# Patient Record
Sex: Female | Born: 1985 | Race: White | Hispanic: No | Marital: Married | Smoking: Never smoker
Health system: Southern US, Community
[De-identification: ages and names within clinical notes are randomized; demographics above are authoritative.]

## PROBLEM LIST (undated history)

## (undated) DIAGNOSIS — I471 Supraventricular tachycardia, unspecified: Secondary | ICD-10-CM

## (undated) DIAGNOSIS — Z1501 Genetic susceptibility to malignant neoplasm of breast: Secondary | ICD-10-CM

## (undated) DIAGNOSIS — Z15068 Genetic susceptibility to other malignant neoplasm of digestive system: Secondary | ICD-10-CM

## (undated) DIAGNOSIS — Z8759 Personal history of other complications of pregnancy, childbirth and the puerperium: Secondary | ICD-10-CM

## (undated) DIAGNOSIS — Z1509 Genetic susceptibility to other malignant neoplasm: Secondary | ICD-10-CM

## (undated) HISTORY — DX: Personal history of other complications of pregnancy, childbirth and the puerperium: Z87.59

## (undated) HISTORY — PX: CYSTECTOMY: SUR359

## (undated) HISTORY — PX: BREAST SURGERY: SHX581

---
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
## 2007-02-19 ENCOUNTER — Encounter (INDEPENDENT_AMBULATORY_CARE_PROVIDER_SITE_OTHER): Payer: Self-pay | Admitting: Surgery

## 2007-02-19 ENCOUNTER — Ambulatory Visit (HOSPITAL_BASED_OUTPATIENT_CLINIC_OR_DEPARTMENT_OTHER): Admission: RE | Admit: 2007-02-19 | Discharge: 2007-02-19 | Payer: Self-pay | Admitting: Surgery

## 2008-09-10 ENCOUNTER — Other Ambulatory Visit: Admission: RE | Admit: 2008-09-10 | Discharge: 2008-09-10 | Payer: Self-pay | Admitting: Family Medicine

## 2008-11-09 ENCOUNTER — Ambulatory Visit: Payer: Self-pay | Admitting: Genetic Counselor

## 2011-01-17 NOTE — Op Note (Signed)
[name], [name]                  ACCOUNT NO.:  [id number]   MEDICAL RECORD NO.:  [id number]          PATIENT TYPE:  AMB   LOCATION:  [facility]                         FACILITY:  [facility]   PHYSICIAN:  Thomas A. Cornett, M.D.DATE OF BIRTH:  [birth year]   DATE OF PROCEDURE:  02/19/2007  DATE OF DISCHARGE:                               OPERATIVE REPORT   PREOPERATIVE DIAGNOSIS:  Right axillary sebaceous cyst measuring 2 cm in  maximal diameter.   POSTOPERATIVE DIAGNOSIS:  Right axillary sebaceous cyst measuring 2 cm  in maximal diameter.   PROCEDURE PERFORMED:  Excision of right axillary sebaceous cyst.   SURGEON:  Harriette Bouillon, M.D.   ANESTHESIA:  MAC with 0.25% Sensorcaine with epinephrine.   ESTIMATED BLOOD LOSS:  20 cc.   SPECIMEN:  Cyst and contents to pathology.   INDICATIONS FOR PROCEDURE:  The patient is a 25 year old female with a  painful epidermal inclusion cyst of the right axilla.  She presents  today for removal.   DESCRIPTION OF PROCEDURE:  The patient was placed supine.  After  induction of MAC anesthesia, the right axillary region was prepped and  draped in a sterile fashion.  I infiltrated the skin with 0.25%  Sensorcaine with epinephrine.  An incision was made at the cyst and the  entire cyst and lining were removed in its entirety.  Cautery was used  for hemostasis.  The wound was closed with 4-0 Monocryl.  Steri-Strips  and dry dressings were applied.  All final counts of sponge, needle, and  instruments were found to be correct for this portion of the case.  The  patient was awoke and taken to recovery in satisfactory condition.      Thomas A. Cornett, M.D.  Electronically Signed     TAC/MEDQ  D:  02/19/2007  T:  02/20/2007  Job:  [id number]   cc:   Thomas A. Cornett, M.D.  [address] [place] [address]  [place] [address]

## 2011-06-21 LAB — CBC
HCT: 38.4
Hemoglobin: 13.2
MCHC: 34.2
MCV: 87.9
Platelets: 207
RBC: 4.37
RDW: 12.8
WBC: 6

## 2011-06-21 LAB — BASIC METABOLIC PANEL
BUN: 9
CO2: 25
Calcium: 8.9
Chloride: 104
Creatinine, Ser: 0.65
GFR calc Af Amer: >60
GFR calc non Af Amer: >60
Glucose, Bld: 92
Potassium: 3.4 — ABNORMAL LOW
Sodium: 137

## 2011-06-21 LAB — POCT PREGNANCY, URINE
Operator id: 268271
Preg Test, Ur: NEGATIVE

## 2011-08-17 DIAGNOSIS — Z803 Family history of malignant neoplasm of breast: Secondary | ICD-10-CM | POA: Insufficient documentation

## 2011-12-21 DIAGNOSIS — Z1501 Genetic susceptibility to malignant neoplasm of breast: Secondary | ICD-10-CM | POA: Insufficient documentation

## 2013-03-19 DIAGNOSIS — D249 Benign neoplasm of unspecified breast: Secondary | ICD-10-CM | POA: Insufficient documentation

## 2015-05-15 ENCOUNTER — Ambulatory Visit (INDEPENDENT_AMBULATORY_CARE_PROVIDER_SITE_OTHER): Payer: 59 | Admitting: Physician Assistant

## 2015-05-15 VITALS — BP 116/60 | HR 100 | Temp 98.0°F | Resp 16 | Ht 69.0 in | Wt 218.0 lb

## 2015-05-15 DIAGNOSIS — Z111 Encounter for screening for respiratory tuberculosis: Secondary | ICD-10-CM | POA: Diagnosis not present

## 2015-05-15 NOTE — Patient Instructions (Signed)
Return in 48-72 hours to have test read.

## 2015-05-15 NOTE — Progress Notes (Signed)
[facility] [address] [place] [address] [phone]  Date:  05/15/2015   Name:  [name]   DOB:  1986/01/29   MRN:  [id number]  PCP:  No primary care provider on file.    Chief Complaint: PPD Placement   History of Present Illness:  This is a 29 y.o. female who is presenting for pre-employment TB test. TB questionnaire negative except for travel to [place] for 2 months 3 years ago. She has had many TB tests before and never positive. She is going to be working for a [employer]. She is not sure if she is up to date on other immunizations but is only wanting tb test today.  Review of Systems:  Review of Systems See HPI  There are no active problems to display for this patient.   Prior to Admission medications   Medication Sig Start Date End Date Taking? Authorizing Provider  Levonorgestrel (SKYLA) 13.5 MG IUD by Intrauterine route.    Historical Provider, MD    Allergies  Allergen Reactions  . Contrast Media [Iodinated Diagnostic Agents]   . Lamictal [Lamotrigine]     Past Surgical History  Procedure Laterality Date  . Breast surgery      Social History  Substance Use Topics  . Smoking status: Never Smoker   . Smokeless tobacco: None  . Alcohol Use: None    Family History  Problem Relation Age of Onset  . Cancer Mother   . Cancer Maternal Grandmother   . Cancer Maternal Grandfather     Medication list has been reviewed and updated.  Physical Examination:  Physical Exam  Constitutional: She is oriented to person, place, and time. She appears well-developed and well-nourished. No distress.  HENT:  Head: Normocephalic and atraumatic.  Right Ear: Hearing normal.  Left Ear: Hearing normal.  Nose: Nose normal.  Eyes: Conjunctivae and lids are normal. Right eye exhibits no discharge. Left eye exhibits no discharge. No scleral icterus.  Pulmonary/Chest: Effort normal. No respiratory distress.  Musculoskeletal: Normal range of  motion.  Neurological: She is alert and oriented to person, place, and time.  Skin: Skin is warm, dry and intact. No lesion and no rash noted.  Psychiatric: She has a normal mood and affect. Her speech is normal and behavior is normal. Thought content normal.   BP 116/60 mmHg  Pulse 100  Temp(Src) 98 F (36.7 C) (Oral)  Resp 16  Ht  (1.753 m)  Wt 218 lb (98.884 kg)  BMI 32.18 kg/m2  SpO2 98%  Assessment and Plan:  1. Screening-pulmonary TB - TB Skin Test   Roswell Miners. Dyke Brackett, MHS [facility]  05/15/2015

## 2015-05-15 NOTE — Progress Notes (Signed)
.   Tuberculosis Risk Questionnaire  1. No Were you born outside the [place] in one of the following parts of the world: Africa, Asia, Central America, South America or Eastern [place]?    2. Yes Have you traveled outside the [place] and lived for more than one month in one of the following parts of the world: Africa, Asia, [place], [place] or Eastern [place]?    3. No Do you have a compromised immune system such as from any of the following conditions:HIV/AIDS, organ or bone marrow transplantation, diabetes, immunosuppressive medicines (e.g. Prednisone, Remicaide), leukemia, lymphoma, cancer of the head or neck, gastrectomy or jejunal bypass, end-stage renal disease (on dialysis), or silicosis?     4. No Have you ever or do you plan on working in: a residential care center, a health care facility, a jail or prison or homeless shelter?    5. No Have you ever: injected illegal drugs, used crack cocaine, lived in a homeless shelter  or been in jail or prison?     6. No Have you ever been exposed to anyone with infectious tuberculosis?    Tuberculosis Symptom Questionnaire  Do you currently have any of the following symptoms?  1. No Unexplained cough lasting more than 3 weeks?   2. No Unexplained fever lasting more than 3 weeks.   3. No Night Sweats (sweating that leaves the bedclothes and sheets wet)     4. No Shortness of Breath   5. No Chest Pain   6. No Unintentional weight loss    7. No Unexplained fatigue (very tired for no reason)   

## 2015-05-18 ENCOUNTER — Ambulatory Visit: Payer: 59

## 2015-05-18 ENCOUNTER — Other Ambulatory Visit: Payer: Self-pay | Admitting: Obstetrics and Gynecology

## 2015-05-18 DIAGNOSIS — Z111 Encounter for screening for respiratory tuberculosis: Secondary | ICD-10-CM

## 2015-05-18 LAB — TB SKIN TEST
Induration: 0 mm
TB Skin Test: NEGATIVE

## 2015-05-19 LAB — CYTOLOGY - PAP

## 2016-03-01 ENCOUNTER — Other Ambulatory Visit: Payer: Self-pay | Admitting: Obstetrics and Gynecology

## 2016-03-01 DIAGNOSIS — N6320 Unspecified lump in the left breast, unspecified quadrant: Secondary | ICD-10-CM

## 2016-03-09 ENCOUNTER — Other Ambulatory Visit: Payer: Self-pay

## 2016-08-24 LAB — OB RESULTS CONSOLE ABO/RH: RH Type: NEGATIVE

## 2016-08-24 LAB — OB RESULTS CONSOLE RUBELLA ANTIBODY, IGM: Rubella: IMMUNE

## 2016-08-24 LAB — OB RESULTS CONSOLE ANTIBODY SCREEN: Antibody Screen: NEGATIVE

## 2016-08-24 LAB — OB RESULTS CONSOLE RPR: RPR: NONREACTIVE

## 2016-08-24 LAB — OB RESULTS CONSOLE HIV ANTIBODY (ROUTINE TESTING): HIV: NONREACTIVE

## 2016-08-24 LAB — OB RESULTS CONSOLE HEPATITIS B SURFACE ANTIGEN: Hepatitis B Surface Ag: NEGATIVE

## 2016-09-04 NOTE — L&D Delivery Note (Addendum)
Delivery Note Birth center patient for hospital birth d/t velamentous CI SROM at 0300 Latent labor augmented w/ oral misoprostol 50 mcg x 1 dose at 0845 Hydrotherapy for pain control.Patient out of tub for second stage d/t increased bloody show.  FHT category 1 in 1st and 2nd stage.   At 3:07 PM a viable female was delivered via Vaginal, Spontaneous Delivery (Presentation: LOA to LOT ; compound L arm ). CAN x 1 tight, baby somersaulted on perineum and cord unwound after delivery.  APGAR: 9, 9; weight 8 lb 6.8 oz (3820 g).   Placenta status: partial separation and brisk bleed noted. Manual removal with uterine guarding. Intermittent atony noted, manual sweep attempted and adherent clot palp in fundus, suspect retained placenta. Placenta inspected, velamentous cord noted, part of placenta plate missing with tearing during manual removal. Uterotonics given - Pitocin IM, misoprostol 200 mcg buccal and 600 mcg rectal, methergine 0.2 mg IM. IV fluids started and Pitocin 20 units in LR 1L bolus started. Dr. Billy Coastaavon paged to room and arrived within 5 min. Manual uterine sweep done by MD under IV Fentanyl 100 mcg analgesia. Per MD no further placental parts felt in uterus, Bleeding minimal after sweep. Vitals stable.  Ancef 2 gm IV one dose given for manual uterine exploration.   Manual exploration completed. Minimal placental tissue obtained. Multiple blood clots evacuated. Good uterine tone. Minimal bleeding after procedure. CBC pending at 1900 and tomorrow 0500.  Anesthesia: Local, IV narcotic  Episiotomy: None Lacerations: 2nd degree;Labial Suture Repair: 2.0 3.0 vicryl Est. Blood Loss (mL): 1300  Mom to postpartum.  Baby to Couplet care / Skin to Skin.  Neta MendsDaniela C Paul, CNM 03/02/2017, 4:01 PM

## 2016-10-20 ENCOUNTER — Other Ambulatory Visit (HOSPITAL_COMMUNITY): Payer: Self-pay

## 2016-10-20 DIAGNOSIS — O43122 Velamentous insertion of umbilical cord, second trimester: Secondary | ICD-10-CM

## 2016-10-20 DIAGNOSIS — O4392 Unspecified placental disorder, second trimester: Secondary | ICD-10-CM

## 2016-11-03 ENCOUNTER — Encounter (HOSPITAL_COMMUNITY): Payer: Self-pay | Admitting: *Deleted

## 2016-11-07 ENCOUNTER — Encounter (HOSPITAL_COMMUNITY): Payer: Self-pay

## 2016-11-07 ENCOUNTER — Ambulatory Visit (HOSPITAL_COMMUNITY)
Admission: RE | Admit: 2016-11-07 | Discharge: 2016-11-07 | Disposition: A | Discharge status: Home / Self Care | Payer: 59 | Source: Ambulatory Visit

## 2016-11-07 ENCOUNTER — Other Ambulatory Visit (HOSPITAL_COMMUNITY): Payer: Self-pay | Admitting: *Deleted

## 2016-11-07 ENCOUNTER — Other Ambulatory Visit (HOSPITAL_COMMUNITY): Payer: Self-pay

## 2016-11-07 DIAGNOSIS — Z363 Encounter for antenatal screening for malformations: Secondary | ICD-10-CM

## 2016-11-07 DIAGNOSIS — O43122 Velamentous insertion of umbilical cord, second trimester: Secondary | ICD-10-CM

## 2016-11-07 DIAGNOSIS — Z3A23 23 weeks gestation of pregnancy: Secondary | ICD-10-CM | POA: Diagnosis not present

## 2016-11-07 DIAGNOSIS — O4392 Unspecified placental disorder, second trimester: Secondary | ICD-10-CM

## 2016-11-07 DIAGNOSIS — O43102 Malformation of placenta, unspecified, second trimester: Secondary | ICD-10-CM

## 2016-11-07 DIAGNOSIS — O43192 Other malformation of placenta, second trimester: Secondary | ICD-10-CM | POA: Insufficient documentation

## 2016-11-07 HISTORY — DX: Genetic susceptibility to malignant neoplasm of breast: Z15.01

## 2016-11-07 HISTORY — DX: Genetic susceptibility to malignant neoplasm of breast: Z15.09

## 2016-11-07 HISTORY — DX: Genetic susceptibility to other malignant neoplasm of digestive system: Z15.068

## 2016-11-20 ENCOUNTER — Encounter (HOSPITAL_COMMUNITY): Payer: Self-pay

## 2016-11-21 ENCOUNTER — Other Ambulatory Visit (HOSPITAL_COMMUNITY): Payer: Self-pay | Admitting: Maternal and Fetal Medicine

## 2016-11-21 ENCOUNTER — Ambulatory Visit (HOSPITAL_COMMUNITY)
Admission: RE | Admit: 2016-11-21 | Discharge: 2016-11-21 | Disposition: A | Discharge status: Home / Self Care | Payer: 59 | Source: Ambulatory Visit

## 2016-11-21 ENCOUNTER — Encounter (HOSPITAL_COMMUNITY): Payer: Self-pay

## 2016-11-21 DIAGNOSIS — Z3A25 25 weeks gestation of pregnancy: Secondary | ICD-10-CM

## 2016-11-21 DIAGNOSIS — Z362 Encounter for other antenatal screening follow-up: Secondary | ICD-10-CM | POA: Insufficient documentation

## 2016-11-21 DIAGNOSIS — O43192 Other malformation of placenta, second trimester: Secondary | ICD-10-CM | POA: Diagnosis not present

## 2016-11-21 DIAGNOSIS — O43102 Malformation of placenta, unspecified, second trimester: Secondary | ICD-10-CM

## 2016-11-21 DIAGNOSIS — Z3686 Encounter for antenatal screening for cervical length: Secondary | ICD-10-CM | POA: Diagnosis not present

## 2016-12-05 ENCOUNTER — Encounter (HOSPITAL_COMMUNITY): Payer: Self-pay

## 2016-12-05 ENCOUNTER — Ambulatory Visit (HOSPITAL_COMMUNITY)
Admission: RE | Admit: 2016-12-05 | Discharge: 2016-12-05 | Disposition: A | Discharge status: Home / Self Care | Payer: 59 | Source: Ambulatory Visit

## 2016-12-05 DIAGNOSIS — O43102 Malformation of placenta, unspecified, second trimester: Secondary | ICD-10-CM

## 2016-12-05 DIAGNOSIS — Z3A27 27 weeks gestation of pregnancy: Secondary | ICD-10-CM | POA: Insufficient documentation

## 2016-12-06 ENCOUNTER — Other Ambulatory Visit (HOSPITAL_COMMUNITY): Payer: Self-pay | Admitting: *Deleted

## 2016-12-06 DIAGNOSIS — O43123 Velamentous insertion of umbilical cord, third trimester: Secondary | ICD-10-CM

## 2017-01-02 ENCOUNTER — Ambulatory Visit (HOSPITAL_COMMUNITY): Payer: 59

## 2017-01-16 ENCOUNTER — Ambulatory Visit (HOSPITAL_COMMUNITY): Payer: 59

## 2017-01-18 ENCOUNTER — Ambulatory Visit (HOSPITAL_COMMUNITY)
Admission: RE | Admit: 2017-01-18 | Discharge: 2017-01-18 | Disposition: A | Discharge status: Home / Self Care | Payer: 59 | Source: Ambulatory Visit

## 2017-01-18 ENCOUNTER — Other Ambulatory Visit (HOSPITAL_COMMUNITY): Payer: Self-pay | Admitting: Maternal & Fetal Medicine

## 2017-01-18 ENCOUNTER — Encounter (HOSPITAL_COMMUNITY): Payer: Self-pay

## 2017-01-18 DIAGNOSIS — Z3A33 33 weeks gestation of pregnancy: Secondary | ICD-10-CM | POA: Diagnosis not present

## 2017-01-18 DIAGNOSIS — O43123 Velamentous insertion of umbilical cord, third trimester: Secondary | ICD-10-CM | POA: Diagnosis present

## 2017-01-18 DIAGNOSIS — Z362 Encounter for other antenatal screening follow-up: Secondary | ICD-10-CM | POA: Insufficient documentation

## 2017-01-18 DIAGNOSIS — O99213 Obesity complicating pregnancy, third trimester: Secondary | ICD-10-CM | POA: Diagnosis not present

## 2017-01-19 ENCOUNTER — Other Ambulatory Visit (HOSPITAL_COMMUNITY): Payer: Self-pay | Admitting: *Deleted

## 2017-02-08 LAB — OB RESULTS CONSOLE GBS: GBS: NEGATIVE

## 2017-02-15 ENCOUNTER — Ambulatory Visit (HOSPITAL_COMMUNITY)
Admission: RE | Admit: 2017-02-15 | Discharge: 2017-02-15 | Disposition: A | Discharge status: Home / Self Care | Payer: 59 | Source: Ambulatory Visit

## 2017-02-15 ENCOUNTER — Encounter (HOSPITAL_COMMUNITY): Payer: Self-pay

## 2017-02-15 DIAGNOSIS — O43129 Velamentous insertion of umbilical cord, unspecified trimester: Secondary | ICD-10-CM | POA: Insufficient documentation

## 2017-02-15 DIAGNOSIS — O99213 Obesity complicating pregnancy, third trimester: Secondary | ICD-10-CM | POA: Insufficient documentation

## 2017-02-15 DIAGNOSIS — Z362 Encounter for other antenatal screening follow-up: Secondary | ICD-10-CM | POA: Diagnosis present

## 2017-02-15 DIAGNOSIS — Z3A37 37 weeks gestation of pregnancy: Secondary | ICD-10-CM | POA: Diagnosis not present

## 2017-03-02 ENCOUNTER — Inpatient Hospital Stay (HOSPITAL_COMMUNITY)
Admission: AD | Admit: 2017-03-02 | Discharge: 2017-03-04 | DRG: 767 | Disposition: A | Discharge status: Home / Self Care | Payer: 59 | Source: Ambulatory Visit | Attending: Obstetrics and Gynecology | Admitting: Obstetrics and Gynecology

## 2017-03-02 ENCOUNTER — Encounter (HOSPITAL_COMMUNITY): Payer: Self-pay

## 2017-03-02 DIAGNOSIS — Z6791 Unspecified blood type, Rh negative: Secondary | ICD-10-CM

## 2017-03-02 DIAGNOSIS — Z3A40 40 weeks gestation of pregnancy: Secondary | ICD-10-CM | POA: Diagnosis not present

## 2017-03-02 DIAGNOSIS — Z3493 Encounter for supervision of normal pregnancy, unspecified, third trimester: Secondary | ICD-10-CM | POA: Diagnosis present

## 2017-03-02 DIAGNOSIS — D649 Anemia, unspecified: Secondary | ICD-10-CM | POA: Diagnosis present

## 2017-03-02 DIAGNOSIS — O43123 Velamentous insertion of umbilical cord, third trimester: Secondary | ICD-10-CM | POA: Diagnosis present

## 2017-03-02 DIAGNOSIS — O26893 Other specified pregnancy related conditions, third trimester: Secondary | ICD-10-CM | POA: Diagnosis present

## 2017-03-02 DIAGNOSIS — Z3490 Encounter for supervision of normal pregnancy, unspecified, unspecified trimester: Secondary | ICD-10-CM

## 2017-03-02 DIAGNOSIS — O99214 Obesity complicating childbirth: Secondary | ICD-10-CM | POA: Diagnosis present

## 2017-03-02 DIAGNOSIS — O9942 Diseases of the circulatory system complicating childbirth: Secondary | ICD-10-CM | POA: Diagnosis present

## 2017-03-02 DIAGNOSIS — E669 Obesity, unspecified: Secondary | ICD-10-CM | POA: Diagnosis present

## 2017-03-02 DIAGNOSIS — O2243 Hemorrhoids in pregnancy, third trimester: Secondary | ICD-10-CM | POA: Diagnosis present

## 2017-03-02 DIAGNOSIS — I471 Supraventricular tachycardia: Secondary | ICD-10-CM | POA: Diagnosis present

## 2017-03-02 DIAGNOSIS — O9902 Anemia complicating childbirth: Secondary | ICD-10-CM | POA: Diagnosis present

## 2017-03-02 DIAGNOSIS — Z6835 Body mass index (BMI) 35.0-35.9, adult: Secondary | ICD-10-CM | POA: Diagnosis not present

## 2017-03-02 HISTORY — DX: Supraventricular tachycardia, unspecified: I47.10

## 2017-03-02 HISTORY — DX: Supraventricular tachycardia: I47.1

## 2017-03-02 LAB — ABO/RH: ABO/RH(D): O NEG

## 2017-03-02 LAB — CBC
HCT: 30.8 % — ABNORMAL LOW (ref 36.0–46.0)
HCT: 31 % — ABNORMAL LOW (ref 36.0–46.0)
HCT: 27.8 % — ABNORMAL LOW (ref 36.0–46.0)
Hemoglobin: 10.3 g/dL — ABNORMAL LOW (ref 12.0–15.0)
Hemoglobin: 10.3 g/dL — ABNORMAL LOW (ref 12.0–15.0)
Hemoglobin: 9.4 g/dL — ABNORMAL LOW (ref 12.0–15.0)
MCH: 28 pg (ref 26.0–34.0)
MCH: 27.8 pg (ref 26.0–34.0)
MCH: 28 pg (ref 26.0–34.0)
MCHC: 33.4 g/dL (ref 30.0–36.0)
MCHC: 33.2 g/dL (ref 30.0–36.0)
MCHC: 33.8 g/dL (ref 30.0–36.0)
MCV: 83.7 fL (ref 78.0–100.0)
MCV: 83.6 fL (ref 78.0–100.0)
MCV: 82.7 fL (ref 78.0–100.0)
Platelets: 208 10*3/uL (ref 150–400)
Platelets: 245 10*3/uL (ref 150–400)
Platelets: 225 10*3/uL (ref 150–400)
RBC: 3.68 MIL/uL — ABNORMAL LOW (ref 3.87–5.11)
RBC: 3.71 MIL/uL — ABNORMAL LOW (ref 3.87–5.11)
RBC: 3.36 MIL/uL — ABNORMAL LOW (ref 3.87–5.11)
RDW: 14.3 % (ref 11.5–15.5)
RDW: 14.4 % (ref 11.5–15.5)
RDW: 14.3 % (ref 11.5–15.5)
WBC: 12.6 10*3/uL — ABNORMAL HIGH (ref 4.0–10.5)
WBC: 24.1 10*3/uL — ABNORMAL HIGH (ref 4.0–10.5)
WBC: 21.6 10*3/uL — ABNORMAL HIGH (ref 4.0–10.5)

## 2017-03-02 LAB — PREPARE RBC (CROSSMATCH)

## 2017-03-02 MED ORDER — ONDANSETRON HCL 4 MG/2ML IJ SOLN: 4.0000 mg | INTRAMUSCULAR | Status: DC | PRN

## 2017-03-02 MED ORDER — FENTANYL CITRATE (PF) 100 MCG/2ML IJ SOLN
INTRAMUSCULAR | Status: AC
  Administered 2017-03-02: 100 ug
  Filled 2017-03-02: qty 2

## 2017-03-02 MED ORDER — FENTANYL CITRATE (PF) 100 MCG/2ML IJ SOLN: 100.0000 ug | Freq: Once | INTRAMUSCULAR | Status: DC

## 2017-03-02 MED ORDER — ZOLPIDEM TARTRATE 5 MG PO TABS: 5.0000 mg | ORAL_TABLET | Freq: Every evening | ORAL | Status: DC | PRN

## 2017-03-02 MED ORDER — ACETAMINOPHEN 325 MG PO TABS
650.0000 mg | ORAL_TABLET | ORAL | Status: DC | PRN
Start: 2017-03-02 — End: 2017-03-04

## 2017-03-02 MED ORDER — BENZOCAINE-MENTHOL 20-0.5 % EX AERO
1.0000 "application " | INHALATION_SPRAY | CUTANEOUS | Status: DC | PRN
  Administered 2017-03-02 – 2017-03-03 (×2): 1 via TOPICAL
  Filled 2017-03-02 (×2): qty 56

## 2017-03-02 MED ORDER — ONDANSETRON HCL 4 MG PO TABS: 4.0000 mg | ORAL_TABLET | ORAL | Status: DC | PRN

## 2017-03-02 MED ORDER — LIDOCAINE HCL (PF) 1 % IJ SOLN
30.0000 mL | INTRAMUSCULAR | Status: DC | PRN
  Administered 2017-03-02: 30 mL via SUBCUTANEOUS
  Filled 2017-03-02: qty 30

## 2017-03-02 MED ORDER — LACTATED RINGERS IV BOLUS (SEPSIS): 1000.0000 mL | Freq: Once | INTRAVENOUS | Status: DC

## 2017-03-02 MED ORDER — WITCH HAZEL-GLYCERIN EX PADS: 1.0000 "application " | MEDICATED_PAD | CUTANEOUS | Status: DC | PRN

## 2017-03-02 MED ORDER — SENNOSIDES-DOCUSATE SODIUM 8.6-50 MG PO TABS
2.0000 | ORAL_TABLET | ORAL | Status: DC
  Administered 2017-03-03 – 2017-03-04 (×2): 2 via ORAL
  Filled 2017-03-02 (×2): qty 2

## 2017-03-02 MED ORDER — OXYTOCIN 10 UNIT/ML IJ SOLN
10.0000 [IU] | Freq: Once | INTRAMUSCULAR | Status: DC
Start: 2017-03-02 — End: 2017-03-02

## 2017-03-02 MED ORDER — SOD CITRATE-CITRIC ACID 500-334 MG/5ML PO SOLN: 30.0000 mL | ORAL | Status: DC | PRN

## 2017-03-02 MED ORDER — MISOPROSTOL 200 MCG PO TABS
50.0000 ug | ORAL_TABLET | ORAL | Status: DC
  Administered 2017-03-02: 50 ug via ORAL
  Filled 2017-03-02: qty 1

## 2017-03-02 MED ORDER — MISOPROSTOL 200 MCG PO TABS
800.0000 ug | ORAL_TABLET | Freq: Once | ORAL | Status: AC
  Administered 2017-03-02: 800 ug via RECTAL

## 2017-03-02 MED ORDER — IBUPROFEN 600 MG PO TABS
600.0000 mg | ORAL_TABLET | Freq: Four times a day (QID) | ORAL | Status: DC
  Administered 2017-03-02 – 2017-03-04 (×6): 600 mg via ORAL
  Filled 2017-03-02 (×8): qty 1

## 2017-03-02 MED ORDER — OXYTOCIN 40 UNITS IN LACTATED RINGERS INFUSION - SIMPLE MED
10.0000 [IU]/h | INTRAVENOUS | Status: DC
  Administered 2017-03-02: 10 [IU]/h via INTRAVENOUS
  Filled 2017-03-02: qty 1000

## 2017-03-02 MED ORDER — CEFAZOLIN SODIUM-DEXTROSE 2-4 GM/100ML-% IV SOLN
2.0000 g | Freq: Once | INTRAVENOUS | Status: AC
  Administered 2017-03-02: 2 g via INTRAVENOUS
  Filled 2017-03-02: qty 100

## 2017-03-02 MED ORDER — BISACODYL 10 MG RE SUPP: 10.0000 mg | Freq: Every day | RECTAL | Status: DC | PRN

## 2017-03-02 MED ORDER — PRENATAL MULTIVITAMIN CH
1.0000 | ORAL_TABLET | Freq: Every day | ORAL | Status: DC
  Administered 2017-03-03 – 2017-03-04 (×2): 1 via ORAL
  Filled 2017-03-02 (×2): qty 1

## 2017-03-02 MED ORDER — ONDANSETRON HCL 4 MG/2ML IJ SOLN: 4.0000 mg | Freq: Four times a day (QID) | INTRAMUSCULAR | Status: DC | PRN

## 2017-03-02 MED ORDER — SIMETHICONE 80 MG PO CHEW
80.0000 mg | CHEWABLE_TABLET | ORAL | Status: DC | PRN
Start: 2017-03-02 — End: 2017-03-04

## 2017-03-02 MED ORDER — METHYLERGONOVINE MALEATE 0.2 MG/ML IJ SOLN: 0.2000 mg | INTRAMUSCULAR | Status: DC | PRN

## 2017-03-02 MED ORDER — METHYLERGONOVINE MALEATE 0.2 MG PO TABS
0.2000 mg | ORAL_TABLET | ORAL | Status: DC | PRN
  Administered 2017-03-02 – 2017-03-03 (×5): 0.2 mg via ORAL
  Filled 2017-03-02 (×5): qty 1

## 2017-03-02 MED ORDER — DIPHENHYDRAMINE HCL 25 MG PO CAPS: 25.0000 mg | ORAL_CAPSULE | Freq: Four times a day (QID) | ORAL | Status: DC | PRN

## 2017-03-02 MED ORDER — SODIUM CHLORIDE 0.9 % IV SOLN: Freq: Once | INTRAVENOUS | Status: DC | PRN

## 2017-03-02 MED ORDER — POLYSACCHARIDE IRON COMPLEX 150 MG PO CAPS
150.0000 mg | ORAL_CAPSULE | Freq: Two times a day (BID) | ORAL | Status: DC
  Administered 2017-03-03 – 2017-03-04 (×4): 150 mg via ORAL
  Filled 2017-03-02 (×4): qty 1

## 2017-03-02 MED ORDER — ACETAMINOPHEN 325 MG PO TABS: 650.0000 mg | ORAL_TABLET | ORAL | Status: DC | PRN

## 2017-03-02 MED ORDER — TERBUTALINE SULFATE 1 MG/ML IJ SOLN
0.2500 mg | Freq: Once | INTRAMUSCULAR | Status: DC | PRN
Start: 2017-03-02 — End: 2017-03-02
  Filled 2017-03-02: qty 1

## 2017-03-02 MED ORDER — FLEET ENEMA 7-19 GM/118ML RE ENEM: 1.0000 | ENEMA | Freq: Every day | RECTAL | Status: DC | PRN

## 2017-03-02 MED ORDER — MAGNESIUM OXIDE 400 (241.3 MG) MG PO TABS
400.0000 mg | ORAL_TABLET | Freq: Every day | ORAL | Status: DC
  Administered 2017-03-03 – 2017-03-04 (×2): 400 mg via ORAL
  Filled 2017-03-02 (×3): qty 1

## 2017-03-02 MED ORDER — OXYCODONE-ACETAMINOPHEN 5-325 MG PO TABS: 1.0000 | ORAL_TABLET | ORAL | Status: DC | PRN

## 2017-03-02 MED ORDER — TETANUS-DIPHTH-ACELL PERTUSSIS 5-2.5-18.5 LF-MCG/0.5 IM SUSP: 0.5000 mL | Freq: Once | INTRAMUSCULAR | Status: DC

## 2017-03-02 MED ORDER — OXYTOCIN 10 UNIT/ML IJ SOLN
INTRAMUSCULAR | Status: AC
  Administered 2017-03-02: 10 [IU]
  Filled 2017-03-02: qty 1

## 2017-03-02 MED ORDER — DIBUCAINE 1 % RE OINT: 1.0000 "application " | TOPICAL_OINTMENT | RECTAL | Status: DC | PRN

## 2017-03-02 MED ORDER — COCONUT OIL OIL: 1.0000 "application " | TOPICAL_OIL | Status: DC | PRN

## 2017-03-02 MED ORDER — OXYCODONE-ACETAMINOPHEN 5-325 MG PO TABS
2.0000 | ORAL_TABLET | ORAL | Status: DC | PRN
Start: 2017-03-02 — End: 2017-03-02

## 2017-03-02 MED ORDER — OXYTOCIN 40 UNITS IN LACTATED RINGERS INFUSION - SIMPLE MED
INTRAVENOUS | Status: AC
  Administered 2017-03-02: 10 [IU]/h via INTRAVENOUS
  Filled 2017-03-02: qty 1000

## 2017-03-02 MED ORDER — METHYLERGONOVINE MALEATE 0.2 MG/ML IJ SOLN
0.2000 mg | Freq: Once | INTRAMUSCULAR | Status: AC
  Administered 2017-03-02: 0.2 mg via INTRAMUSCULAR

## 2017-03-02 NOTE — Progress Notes (Signed)
Alvia Grovenna Sophia Kluever is a 31 y.o. G2P0010 at 2151w0d by LMP admitted for early labor and SROM Misoprostol 50 mcg one dose   Subjective:  Feeling painful ctx, laboring on birthing ball and [facility] present and helping.   Objective: Vitals:   03/02/17 0809  Resp: 18  Temp: 98.4 F (36.9 C)  TempSrc: Oral  Weight: 109.3 kg (241 lb)  Height: 5\' 9"  (1.753 m)    No intake/output data recorded. No intake/output data recorded.   FHT 130's intermittent, no audible decels Ctx q 2-3 min, palp mod/strong  SVE:   Dilation: 6 Effacement (%): 100 Station: -1, 0 Exam by:: Colon Flattery. Ellyn Rubiano CNM  Labs:   Recent Labs  03/02/17 0906  WBC 12.6*  HGB 10.3*  HCT 30.8*  PLT 208    Assessment / Plan: Augmentation of labor, progressing well  Labor:  continue w/ expectant management Preeclampsia:  no signs or symptoms of toxicity Fetal Wellbeing:  Category I Pain Control:  Water tub I/D:  GBS neg Anticipated MOD:  NSVD  Neta Mendsaniela C Reynolds Kittel, CNM, MSN 03/02/2017, 12:38 PM

## 2017-03-02 NOTE — H&P (Signed)
OB ADMISSION/ HISTORY & PHYSICAL:  Admission Date: 03/02/2017  7:37 AM  Admit Diagnosis: Term pregnancy in early labor and SROM  [name] is a 31 y.o. female presents in early labor and SROM at 52 w/ clear AF. Planned hospital birth / desires waterbirth. .  Prenatal History: G2P0010   EDC : 03/02/2017, by Other Basis  Prenatal care at [facility] since 1st trim.  Prenatal course complicated by velamentous CI, previa resolved in 2nd trimester, questionable vasa previa / followed by MFM and cleared for vaginal birth after serial sonos. Pregnancy conceived with IUD Mirena in situ, removed in 1st trimester.  SVT diagnosed during 3rd trimester, cardio w/u done and OK for vaginal birth.  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  Obese and increased maternal weigh gain - TWG 32 lbs  Prenatal Labs: ABO, Rh: O (12/21 0000)  Antibody: Negative (12/21 0000) Rubella: Immune (12/21 0000)  RPR: Nonreactive (12/21 0000)  HBsAg: Negative (12/21 0000)  HIV: Non-reactive (12/21 0000)  GBS: Negative (06/07 0000)  2hr Glucola : wnl  AFP negative  Normal female anatomy,Serial growth sono, last at 72w6dEFW 7'5" (76%)   Medical / Surgical History :  Past medical history:  Past Medical History:  Diagnosis Date  . BRCA2 positive   . SVT (supraventricular tachycardia) (HCC)      Past surgical history:  Past Surgical History:  Procedure Laterality Date  . BREAST SURGERY    . CYSTECTOMY       Family History:  Family History  Problem Relation Age of Onset  . Cancer Mother   . Cancer Maternal Grandmother   . Cancer Maternal Grandfather      Social History:  reports that she has never smoked. She has never used smokeless tobacco. She reports that she does not drink alcohol or use drugs.   Allergies: Contrast media [iodinated diagnostic agents] and Lamictal [lamotrigine]    Current Medications at time of admission:   Prescriptions Prior to Admission  Medication Sig Dispense Refill Last Dose  . ferrous sulfate 325 (65 FE) MG tablet Take 325 mg by mouth daily with breakfast.   03/02/2017  . magnesium 30 MG tablet Take 30 mg by mouth 2 (two) times daily.   03/02/2017  . Prenatal Vit-Fe Fumarate-FA (PRENATAL VITAMIN PO) Take by mouth.   03/02/2017      Review of Systems: ROS + LOF clear, ctx mild, no HA/NV/RUQ pain, + FM.   Physical Exam:  Dilation: 4 Effacement (%): 80 Station: -1 Exam by:: D. Logun Colavito CNM  Clear AF  VSSAF  General:AAO x 3, NAD Heart: intermittent sinus tachy, asymptomatic Lungs:CTAB Abdomen: gravid, NT, vertex per Leopolds Extremities: + 1 pedal edema FHR: 140, mod var, + accels, no decels TOCO: q 2-5 min, mild  Labs:     Recent Labs  03/02/17 0906  WBC 12.6*  HGB 10.3*  HCT 30.8*  PLT 208     Assessment:  31y.o. G2P0010 at 420w0dROM, Hx SVT, Velamentous CI  1. Labor: early 2. Fetal Wellbeing: Category 1  3. Pain Control: desires unmedicated 4. GBS: neg   Plan:  1. Admit to BS 2. Routine L&D orders 3. Misoprostol 50 mcg buccal for cervical ripening / labor augmentation 4. Plan waterbirth / hydrotherapy when active    Consultant: Dr. TaPenelope CoopCNM, MSN 03/02/2017, 10:51 AM

## 2017-03-02 NOTE — Anesthesia Pain Management Evaluation Note (Signed)
  CRNA Pain Management Visit Note  Patient: [name], 31 y.o., female  "Hello [facility]. We have an anesthesia team available at all times to provide care throughout the hospital, including epidural management and anesthesia for C-section. I don't know your plan for the delivery whether it a natural birth, water birth, IV sedation, nitrous supplementation, doula or epidural, but we want to meet your pain goals."   1.Was your pain managed to your expectations on prior hospitalizations?   No prior hospitalizations  2.What is your expectation for pain management during this hospitalization?     Labor support without medications  3.How can we help you reach that goal? Support prn.  Record the patient's initial score and the patient's pain goal.   Pain: 0  Pain Goal: 9 [facility] wants you to be able to say your pain was always managed very well.  [facility] HospitalWRINKLE,[name] 03/02/2017

## 2017-03-02 NOTE — Lactation Note (Addendum)
This note was copied from a baby's chart. Lactation Consultation Note  Patient Name: [name] RUEAV'WToday's Date: 03/02/2017 Reason for consult: Initial assessment   Initial assessment with first time mom of 1 hour old infant. Infant was latched and feeding when LC entered room. EBL > 1300 cc. Mom has doula resent in the room.   Enc mom to BF infant STS 8-12 x in 24 hours at first feeding cues offering both breasts with each feeding. Enc mom to use head and pillow support with feeding. Reviewed BF basics, colostrum, milk coming to volume, supply and demand, cluster feeding, NB nutritional needs and NB feeding behaviors.   Mom reports she does not want to learn hand expression at this time, she reports she has read about it. Enc mom to hand express before and after latch. Mom without further questions a this time. Enc mom to call for assistance as needed.   BF Resources Handout and LC Brochure given, mom informed of IP/OP Services, BF Support Groups and LC phone #.      Maternal Data Formula Feeding for Exclusion: No Has patient been taught Hand Expression?: No Does the patient have breastfeeding experience prior to this delivery?: No  Feeding Feeding Type: Breast Fed  LATCH Score/Interventions Latch: Grasps breast easily, tongue down, lips flanged, rhythmical sucking.  Audible Swallowing: None Intervention(s): Skin to skin  Type of Nipple: Everted at rest and after stimulation  Comfort (Breast/Nipple): Soft / non-tender     Hold (Positioning): No assistance needed to correctly position infant at breast.  LATCH Score: 8  Lactation Tools Discussed/Used WIC Program: No   Consult Status Consult Status: Follow-up Date: 03/03/17 Follow-up type: In-patient    Silas FloodSharon S Izack Hoogland 03/02/2017, 5:12 PM

## 2017-03-03 DIAGNOSIS — O9902 Anemia complicating childbirth: Secondary | ICD-10-CM | POA: Diagnosis present

## 2017-03-03 LAB — CBC
HCT: 26.3 % — ABNORMAL LOW (ref 36.0–46.0)
Hemoglobin: 8.9 g/dL — ABNORMAL LOW (ref 12.0–15.0)
MCH: 28.1 pg (ref 26.0–34.0)
MCHC: 33.8 g/dL (ref 30.0–36.0)
MCV: 83 fL (ref 78.0–100.0)
Platelets: 197 10*3/uL (ref 150–400)
RBC: 3.17 MIL/uL — ABNORMAL LOW (ref 3.87–5.11)
RDW: 14.5 % (ref 11.5–15.5)
WBC: 15.6 10*3/uL — ABNORMAL HIGH (ref 4.0–10.5)

## 2017-03-03 LAB — RPR: RPR Ser Ql: NONREACTIVE

## 2017-03-03 NOTE — Progress Notes (Signed)
MOB was referred for history of depression/anxiety.  Referral is screened out by Clinical Social Worker because none of the following criteria appear to apply and there are no reports impacting the pregnancy or her transition to the postpartum period. CSW does not deem it clinically necessary to further investigate at this time.  -History of anxiety/depression during this pregnancy, or of post-partum depression - Diagnosis of anxiety and/or depression within last 3 years  - History of depression due to pregnancy loss/loss of child or -MOB's symptoms are currently being treated with medication and/or therapy.  No hx of anxiety noted in MOB's chart. Please contact the Clinical Social Worker if needs arise or upon MOB request.   Yomar Mejorado, MSW, LCSW-A Clinical Social Worker  [facility]  Office: [phone]  

## 2017-03-03 NOTE — Progress Notes (Signed)
PPD # 1 SVD Information for the patient's newborn:  Charlott HollerRiley, Girl Daesia [[id number]][[id number]]  female S/P NSVB / manual removal of placenta / PPH 1300cc   breast feeding  Baby name: Grayland JackSibyl Ann  S:  Reports feeling sore but well, no dizziness or chest pain.             Tolerating po/ No nausea or vomiting             Bleeding is decreased             Pain controlled with ibuprofen (OTC)             Up ad lib / ambulatory / voiding without difficulties        O:  A & O x 3, in no apparent distress              VS:  Vitals:   03/02/17 1725 03/02/17 1830 03/02/17 2230 03/03/17 0600  BP: 119/77 139/71 118/69 121/67  Pulse: (!) 105 (!) 115 94 (!) 57  Resp: 20 20 18 18   Temp: 98.3 F (36.8 C) 99 F (37.2 C)  97.4 F (36.3 C)  TempSrc: Oral Oral    Weight:      Height:        LABS:  Recent Labs  03/02/17 1907 03/03/17 0517  WBC 21.6* 15.6*  HGB 9.4* 8.9*  HCT 27.8* 26.3*  PLT 225 197    Blood type: --/--/O NEG (06/29 0915)  Rubella: Immune (12/21 0000)   I&O: I/O last 3 completed shifts: In: -  Out: 1300 [Blood:1300]          No intake/output data recorded.  Lungs: Clear and unlabored  Heart: regular rate and rhythm / no murmurs  Abdomen: soft, non-tender, non-distended             Fundus: firm, non-tender, U-1  Perineum: repair intact, small hemorrhoid noted  Lochia: small  Extremities: no edema, no calf pain or tenderness    A/P: PPD # 1 31 y.o., G2P0010   Principal Problem:   Postpartum care following vaginal delivery 6/29 Active Problems:   Postpartum hemorrhage 1300 cc   Maternal anemia, with delivery   Second-degree perineal laceration, with delivery RH negative - newborn Rh neg - no rhophylac indicated.  Hemorrhoid - mild inflammation   Doing well - stable status  Routine post partum orders  Oral Fe and Mag ox started, alfalfa at discharge  Anusol HC TID, bowel care  Lactation support.   Anticipate discharge tomorrow    Neta Mendsaniela C Mao Lockner, MSN, CNM 03/03/2017,  9:40 AM

## 2017-03-03 NOTE — Lactation Note (Signed)
This note was copied from a baby's chart. Lactation Consultation Note  Patient Name: [name] ZOXWR'UToday's Date: 03/03/2017 Reason for consult: Follow-up assessment Baby sleepy today but Mom reports has latched well without the nipple shield. Assisted Mom with latching baby at this visit, took several attempts and breast compression for baby to sustain latch. Initially non-nutritive at breast but became more nutritive while nursing. Stimulation needed to keep baby awake at breast. Baby now 1826 hours old. Advised parents baby should be at breast 8-12 times in 24 hours and with feeding ques. Keep baby nursing for 15-30 minutes both breasts some feedings, cluster feeding discussed. If baby not staying engaged at breast or sustaining depth with latch use 16 nipple shield. If baby not waking to BF then advised to hand express and have RN demonstrate how to spoon feed or finger feed baby to give her some calories to wake to BF. Set up DEBP for Mom to post pump for 15 minutes at least 4 times/day. Mom had manual removal of placenta and EBL of 1300. Mom does have lots of colostrum with hand expression. Mom to call for assist as needed.   Maternal Data    Feeding Feeding Type: Breast Fed Length of feed: 30 min  LATCH Score/Interventions Latch: Repeated attempts needed to sustain latch, nipple held in mouth throughout feeding, stimulation needed to elicit sucking reflex. Intervention(s): Adjust position;Assist with latch;Breast massage;Breast compression  Audible Swallowing: A few with stimulation  Type of Nipple: Everted at rest and after stimulation (short nipple shafts bilateral)  Comfort (Breast/Nipple): Soft / non-tender     Hold (Positioning): Assistance needed to correctly position infant at breast and maintain latch.  LATCH Score: 7  Lactation Tools Discussed/Used Tools: Pump Breast pump type: Double-Electric Breast Pump Pump Review: Setup, frequency, and cleaning;Milk  Storage Initiated by:: KG Date initiated:: 03/03/17   Consult Status Consult Status: Follow-up Date: 03/04/17 Follow-up type: In-patient    Alfred LevinsGranger, Ilham Roughton Ann 03/03/2017, 5:49 PM

## 2017-03-04 ENCOUNTER — Encounter (HOSPITAL_COMMUNITY): Payer: Self-pay | Admitting: *Deleted

## 2017-03-04 MED ORDER — POLYSACCHARIDE IRON COMPLEX 150 MG PO CAPS: 150.0000 mg | ORAL_CAPSULE | Freq: Two times a day (BID) | ORAL | Status: DC

## 2017-03-04 MED ORDER — HYDROCORTISONE 2.5 % RE CREA: TOPICAL_CREAM | RECTAL | 1 refills | Status: AC

## 2017-03-04 MED ORDER — IBUPROFEN 600 MG PO TABS: 600.0000 mg | ORAL_TABLET | Freq: Four times a day (QID) | ORAL | 0 refills | Status: DC

## 2017-03-04 MED ORDER — ALFALFA 500 MG PO TABS: 2000.0000 mg | ORAL_TABLET | Freq: Every day | ORAL | 0 refills | Status: DC

## 2017-03-04 MED ORDER — MAGNESIUM OXIDE 400 (241.3 MG) MG PO TABS: 400.0000 mg | ORAL_TABLET | Freq: Every day | ORAL | Status: DC

## 2017-03-04 MED ORDER — COCONUT OIL OIL: 1.0000 "application " | TOPICAL_OIL | 0 refills | Status: DC | PRN

## 2017-03-04 MED ORDER — BENZOCAINE-MENTHOL 20-0.5 % EX AERO: 1.0000 "application " | INHALATION_SPRAY | CUTANEOUS | Status: DC | PRN

## 2017-03-04 MED ORDER — ACETAMINOPHEN 325 MG PO TABS: 650.0000 mg | ORAL_TABLET | ORAL | Status: DC | PRN

## 2017-03-04 NOTE — Discharge Summary (Signed)
Obstetric Discharge Summary Reason for Admission: onset of labor and rupture of membranes Prenatal Procedures: ultrasound Intrapartum Procedures: spontaneous vaginal delivery and hydrotherapy Postpartum Procedures: antibiotics Complications-Operative and Postpartum: 2nd degree perineal laceration and hemorrhage Hemoglobin  Date Value Ref Range Status  03/03/2017 8.9 (L) 12.0 - 15.0 g/dL Final   HCT  Date Value Ref Range Status  03/03/2017 26.3 (L) 36.0 - 46.0 % Final    Physical Exam:  General: alert, cooperative and no distress Lochia: appropriate Uterine Fundus: firm Incision: healing well, no significant erythema DVT Evaluation: No cords or calf tenderness. No significant calf/ankle edema.  Discharge Diagnoses: Term Pregnancy-delivered and anemia  Discharge Information: Date: 03/04/2017 Activity: pelvic rest Diet: routine Medications:  Allergies as of 03/04/2017      Reactions   Carrot Oil Anaphylaxis   Contrast Media [iodinated Diagnostic Agents] Anaphylaxis   Gadobenate Shortness Of Breath   Experienced difficulty breathing   Lamictal [lamotrigine] Anaphylaxis      Medication List    STOP taking these medications   ferrous sulfate 325 (65 FE) MG tablet     TAKE these medications   acetaminophen 325 MG tablet Commonly known as:  TYLENOL Take 2 tablets (650 mg total) by mouth every 4 (four) hours as needed (for pain scale < 4).   Alfalfa 500 MG Tabs Take 4 tablets (2,000 mg total) by mouth daily. Start with 500 mg and increase to 2000 mg over 1-2 weeks.   benzocaine-Menthol 20-0.5 % Aero Commonly known as:  DERMOPLAST Apply 1 application topically as needed for irritation (perineal discomfort).   clindamycin 1 % gel Commonly known as:  CLINDAGEL Apply 1 application topically 2 (two) times daily as needed (breakout).   coconut oil Oil Apply 1 application topically as needed.   hydrocortisone 2.5 % rectal cream Commonly known as:  ANUSOL-HC Apply  rectally 2 times daily   ibuprofen 600 MG tablet Commonly known as:  ADVIL,MOTRIN Take 1 tablet (600 mg total) by mouth every 6 (six) hours.   iron polysaccharides 150 MG capsule Commonly known as:  NIFEREX Take 1 capsule (150 mg total) by mouth 2 (two) times daily.   magnesium 30 MG tablet Take 30 mg by mouth 2 (two) times daily.   magnesium oxide 400 (241.3 Mg) MG tablet Commonly known as:  MAG-OX Take 1 tablet (400 mg total) by mouth daily.   metoprolol tartrate 50 MG tablet Commonly known as:  LOPRESSOR Take 1 tablet by mouth daily as needed (palpitations).   PRENATAL VITAMIN PO Take 1 tablet by mouth daily.      Condition: stable Instructions: refer to practice specific booklet Discharge to: home Follow-up Information    Neta Mendsaul, Daniela C, CNM. Schedule an appointment as soon as possible for a visit in 2 week(s).   Specialty:  Obstetrics and Gynecology Contact information: [address] [place] [address] [phone]           Newborn Data: Live born female Richardson ChiquitoSibyl Ann Birth Weight: 8 lb 6.8 oz (3820 g) APGAR: 9, 9  Home with mother.  Neta MendsDaniela C Paul, CNM 03/04/2017, 8:54 AM

## 2017-03-04 NOTE — Lactation Note (Signed)
This note was copied from a baby's chart. Lactation Consultation Note  Patient Name: [name] NFAOZ'HToday's Date: 03/04/2017 Reason for consult: Follow-up assessment Baby at 42 hr of life and dyad set for d/c today. Baby with a 8%wt loss, 8 bf, 4 wet, and 2 stools. Mom has DEBP set up at bedside and used it once. She stated "I got some milk out with it last night" but she did not offer it to the baby. Given curved tip syringe to get the milk out of the pump to place on the spoon and education on spoon feeding. Discussed baby behavior, feeding frequency, pumping, supplementing, baby belly size, voids, wt loss, breast changes, and nipple care. Upon entry baby was latched in football to the R breast. At the start of the feeding it appeared that baby had a deep latch but while we talked the baby slipped to the tip of the nipple. Encouraged mom to maintain a deep latch throughout the feeding. Mom denies breast or nipple pain or soreness despite the compression stipe on the nipple. Dyad has an OP apt for 03/08/17 at 1400. She will offer the breast on demand 8+/24hr, post pump, and offer expressed milk per volume guidelines. Parents understand that if the baby continues to loose wt and mom does not have expressed milk they might need to offer formula. Baby will see her MD 03/05/17.     Maternal Data    Feeding Feeding Type: Breast Fed Length of feed: 20 min  LATCH Score/Interventions Latch: Repeated attempts needed to sustain latch, nipple held in mouth throughout feeding, stimulation needed to elicit sucking reflex. (per RN) Intervention(s): Adjust position;Assist with latch  Audible Swallowing: Spontaneous and intermittent Intervention(s): Hand expression Intervention(s): Skin to skin;Alternate breast massage  Type of Nipple: Everted at rest and after stimulation  Comfort (Breast/Nipple): Soft / non-tender  Problem noted: Mild/Moderate discomfort  Hold (Positioning): Full assist, staff holds  infant at breast (per RN) Intervention(s): Position options  LATCH Score: 7  Lactation Tools Discussed/Used     Consult Status Consult Status: Follow-up Date: 03/08/17 Follow-up type: Out-patient    Rulon Eisenmengerlizabeth E Susanna Benge 03/04/2017, 9:12 AM

## 2017-03-06 LAB — TYPE AND SCREEN
ABO/RH(D): O NEG
Antibody Screen: NEGATIVE
Unit division: 0
Unit division: 0

## 2017-03-06 LAB — BPAM RBC
Blood Product Expiration Date: 201807072359
Blood Product Expiration Date: 201807262359
Unit Type and Rh: 9500
Unit Type and Rh: 9500

## 2017-03-09 ENCOUNTER — Ambulatory Visit (HOSPITAL_COMMUNITY): Payer: 59

## 2017-03-09 ENCOUNTER — Ambulatory Visit: Payer: Self-pay

## 2017-03-09 NOTE — Lactation Note (Deleted)
This note was copied from a baby's chart. Lactation Consult  Mother's reason for visit:  Feeding assessment and assistance with pumping. Visit Type:  Outpatient Appointment Notes:  Mother would like latch check and would like assistance with her breast pump and help to plan for going back to work. Consult:  Initial Lactation Consultant:  Hardie PulleyBerkelhammer, Ruth Boschen  ________________________________________________________________________ Desiree FloresBaby's Name:  Desiree GroveAnna Sophia Baker Date of Birth:  [birth year] Pediatrician:  Lanae CrumblyEtefagh Gender:  female Gestational Age: <None> (At Birth) Birth Weight:    Weight at Discharge:  Weight: 3856 oz                      Date of Discharge:  03/04/2017    Filed Weights   03/02/17 0809  Weight: 3856 oz  Last weight taken from location outside of [facility]:  7 lb 12 oz     Location:Pediatrician's office Weight today:  7 lb 15.2 oz. ________________________________________________________________________  Mother's Name: Desiree ChiquitoSibyl Ann Baker Type of delivery:  Vaginal, Spontaneous Delivery Breastfeeding Experience:  Primip Maternal Medical Conditions: lumpectomy on L breast placed 3 years ago.  Has BRACA gene.  Has titanium clip in left breast.  PPH after birth with fragmented placenta. Maternal Medications:  PNV, iron  ________________________________________________________________________  Breastfeeding History (Post Discharge)  Frequency of breastfeeding:  q 2-3 hours Duration of feeding:  10-20 min  Patient does not supplement or pump.  Infant Intake and Output Assessment  Voids:  6 plus in 24 hrs.  Color:  Clear yellow Stools:  6-7  in 24 hrs.  Color:  Yellow  ________________________________________________________________________  Maternal Breast Assessment  Breast:  Full Nipple:  Erect Pain level:  0 _______________________________________________________________________ Feeding Assessment/Evaluation  Initial feeding  assessment:  Infant's oral assessment:  WNL  Positioning:  Cross cradle Left breast  LATCH documentation:  Latch:  2 = Grasps breast easily, tongue down, lips flanged, rhythmical sucking.  Audible swallowing:  1 = A few with stimulation  Type of nipple:  2 = Everted at rest and after stimulation  Comfort (Breast/Nipple):  2 = Soft / non-tender  Hold (Positioning):  2 = No assistance needed to correctly position infant at breast  LATCH score:  9  Attached assessment:  Deep  Lips flanged:  Yes.    Lips untucked:  No.  Suck assessment:  Nutritive  Pre-feed weight:  3606 g  (7 lb. 15.2 oz.) Post-feed weight:  3644 g (8 lb. 5 oz.) Amount transferred:  38 ml Amount supplemented:  0 ml Breastfed approx 1 hour ago so baby was not fully hungry.  Breastfed for approx 10 min and fell asleep.  Appeared satisfied.  No  Total amount transferred:  38 ml Total supplement given:  0 ml  Mother latched baby easily on L breast with adequate depth.  Sucks and swallows observed.   Reviewed how to pump and set up plan for going back to work.   Recommend mother attend support group and call if she has further questions.

## 2017-03-09 NOTE — Lactation Note (Deleted)
This note was copied from a baby's chart. Lactation Consult  Mother's reason for visit:  Had mastitis, latch assistance Visit Type:  Outpatient Appointment Notes:  7 day old infant.  Mother recently has the beginning of mastitis which now seems resolved.  Want to prevent mastitis and would like latch assistance. Consult:  Initial Lactation Consultant:  [name], [name]  ________________________________________________________________________ Desiree FloresBaby's Name:  Desiree GroveAnna Sophia Baker Date of Birth:  [birth year] Pediatrician:  Normand Sloopillard Gender:  female Gestational Age: <None> (At Birth) Birth Weight:    Weight at Discharge:  Weight: 3856 oz                      Date of Discharge:  03/04/2017    Filed Weights   03/02/17 0809  Weight: 3856 oz  Last weight taken from location outside of [facility]:  8 lb 3 oz (with clothes on)     Location:Pediatrician's office Weight today:  7 lb 15.8 oz  ________________________________________________________________________  Mother's Name: Desiree ChiquitoSibyl Ann Baker Type of delivery:  Vaginal, Spontaneous Delivery Breastfeeding Experience:  Experienced.  Breastfed first child for 2 years. Maternal Medical Conditions:  Thyroid, PCOS, PPH after birth Maternal Medications:  Synthroid (will get thyroid checked at 6 weeks) PNV  ________________________________________________________________________  Breastfeeding History (Post Discharge)  Frequency of breastfeeding:  q 2-3 hours  Duration of feeding:  30 min    Pumping  Type of pump:  Medela pump in style Frequency:  2-3 times per day for 5-7 min. Volume:  approx 60 ml  Infant Intake and Output Assessment  Voids:  Unsure  in 24 hrs.  Color:  Clear yellow Stools:  2 in 24 hrs.  Color:  Yellow  ________________________________________________________________________  Maternal Breast Assessment  Breast:  Full Nipple:  Erect Pain level:   0 _______________________________________________________________________ Feeding Assessment/Evaluation  Initial feeding assessment:  Infant's oral assessment:  WNL  Positioning:  Cross cradle Left breast  LATCH documentation:  Latch:  2 = Grasps breast easily, tongue down, lips flanged, rhythmical sucking.  Audible swallowing:  2 = Spontaneous and intermittent  Type of nipple:  2 = Everted at rest and after stimulation  Comfort (Breast/Nipple):  2 = Soft / non-tender  Hold (Positioning):  2 = No assistance needed to correctly position infant at breast  LATCH score:  10  Attached assessment:  Deep  Lips flanged:  Yes.    Lips untucked:  No.  Suck assessment:  Nutritive  Tools:  Pump Instructed on use and cleaning of tool:  Yes.    Pre-feed weight:  2624 g  (7 lb. 15.8 oz.) Post-feed weight:  3696 g (8 lb. 2.4 oz.) Amount transferred:  62 ml Amount supplemented:  0 ml  No  Total amount transferred:  62 ml Total supplement given:  0 ml  Plan: Breastfeed on demand 8-12 times per day.  While breastfeeding, be sure to massage/compress breast to empty during feeding and pumping.   Get plenty of rest and do not go for long periods without emptying breast fully. Continue to pump after breastfeeding only for a few minutes to fully empty breast.   Suggest having thyroid levels rechecked at 6 weeks due.

## 2017-03-20 ENCOUNTER — Ambulatory Visit: Payer: Self-pay

## 2017-03-20 NOTE — Lactation Note (Signed)
This note was copied from a baby's chart. Lactation Consult  Lactation Note Date of Service: 03/09/2017 6:01 PM Berkelhammer, Verdis Frederickson, RN  Lactation    [] Hide copied text Lactation Consult   Mother's reason for visit:  Feeding assessment and assistance with pumping. Visit Type:  Outpatient Appointment Notes:  Mother would like latch check and would like assistance with her breast pump and help to plan for going back to work. Consult:  Initial Lactation Consultant:  Hardie Pulley   ________________________________________________________________________ [name] Name:  [name] Date of Birth:  03/02/2015 Pediatrician:  Lanae Crumbly Gender:  female Gestational Age: <None> (At Birth) Birth Weight:    Weight at Discharge:  Weight: 3856 oz                      Date of Discharge:  03/04/2017      Filed Weights    03/02/17 0809  Weight: 3856 oz  Last weight taken from location outside of [facility]:  7 lb 12 oz     Location:Pediatrician's office Weight today:  7 lb 15.2 oz. ________________________________________________________________________   Mother's Name: [name] Type of delivery:  Vaginal, Spontaneous Delivery Breastfeeding Experience:  Primip Maternal Medical Conditions: lumpectomy on L breast placed 3 years ago.  Has BRACA gene.  Has titanium clip in left breast.  PPH after birth with fragmented placenta. Maternal Medications:  PNV, iron   ________________________________________________________________________   Breastfeeding History (Post Discharge)   Frequency of breastfeeding:  q 2-3 hours Duration of feeding:  10-20 min   Patient does not supplement or pump.   Infant Intake and Output Assessment   Voids:  6 plus in 24 hrs.  Color:  Clear yellow Stools:  6-7  in 24 hrs.  Color:  Yellow   ________________________________________________________________________   Maternal Breast Assessment   Breast:  Full Nipple:  Erect Pain  level:  0 _______________________________________________________________________ Feeding Assessment/Evaluation   Initial feeding assessment:   Infant's oral assessment:  WNL   Positioning:  Cross cradle Left breast   LATCH documentation:             Latch:  2 = Grasps breast easily, tongue down, lips flanged, rhythmical sucking.             Audible swallowing:  1 = A few with stimulation             Type of nipple:  2 = Everted at rest and after stimulation             Comfort (Breast/Nipple):  2 = Soft / non-tender             Hold (Positioning):  2 = No assistance needed to correctly position infant at breast             LATCH score:  9   Attached assessment:  Deep             Lips flanged:  Yes.               Lips untucked:  No.   Suck assessment:  Nutritive   Pre-feed weight:  3606 g  (7 lb. 15.2 oz.) Post-feed weight:  3644 g (8 lb. 5 oz.) Amount transferred:  38 ml Amount supplemented:  0 ml Breastfed approx 1 hour ago so baby was not fully hungry.  Breastfed for approx 10 min and fell asleep.  Appeared satisfied.   No   Total amount transferred:  38 ml Total  supplement given:  0 ml   Mother latched baby easily on L breast with adequate depth.  Sucks and swallows observed.   Reviewed how to pump and set up plan for going back to work.   Recommend mother attend support group and call if she has further questions.      Electronically signed by Almeta MonasBerkelhammer, Ruth B, RN at 03/09/2017  6:11 PM      FEEDING ASSESSMENT on 03/09/2017         Routing History         Detailed Report

## 2017-07-16 IMAGING — US US MFM OB TRANSVAGINAL
1 series · 13 of 28 positions shown · non-contrast
Comparison: none

[Series 1: us mfm ob transvaginal · 119 acquisitions, 13 frames shown]
[im 5/119]
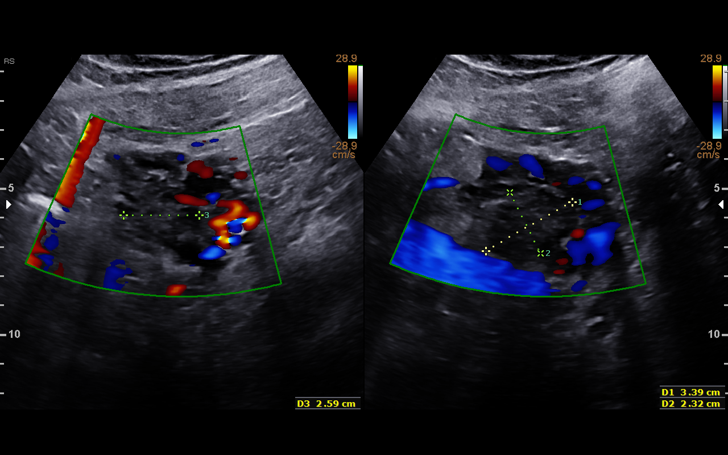
[im 14/119]
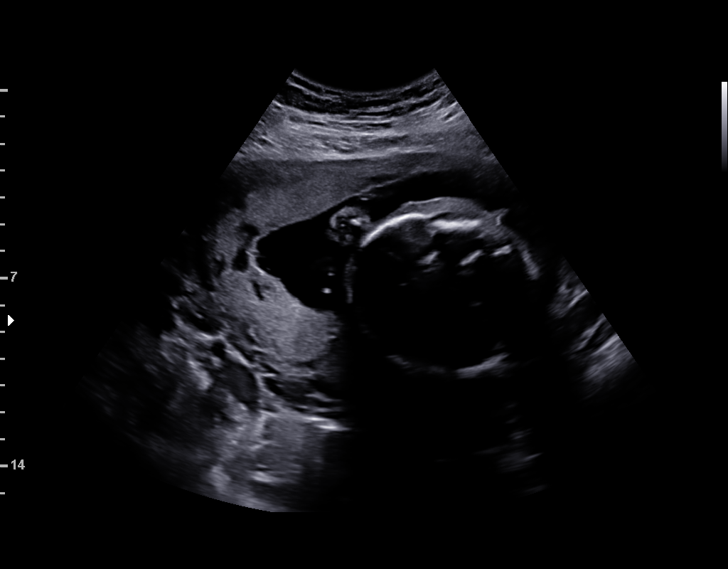
[im 22/119]
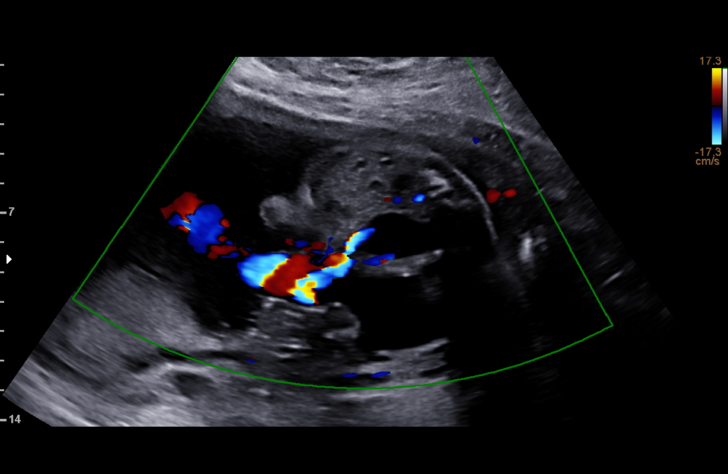
[im 31/119]
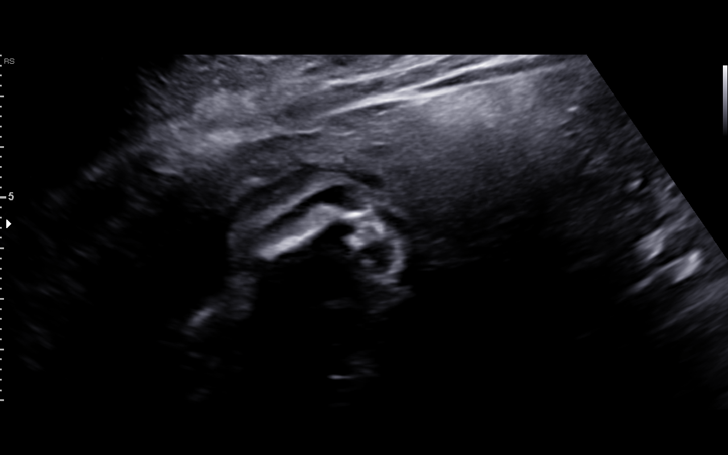
[im 40/119]
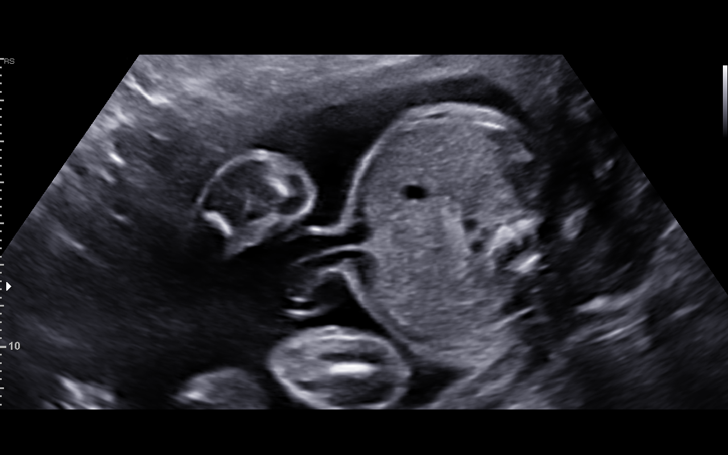
[im 49/119]
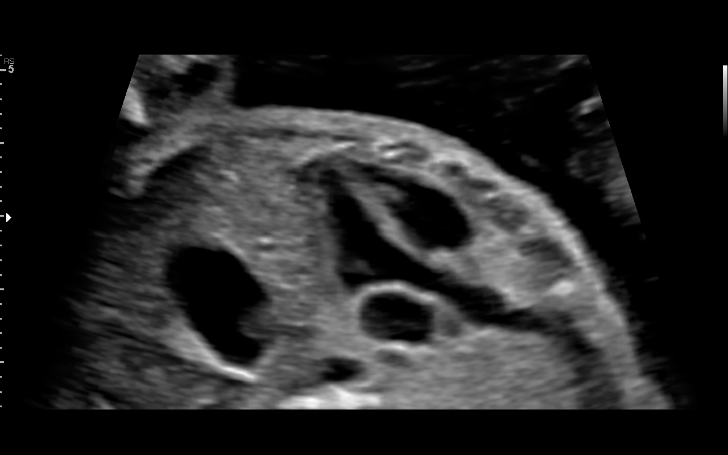
[im 62/119]
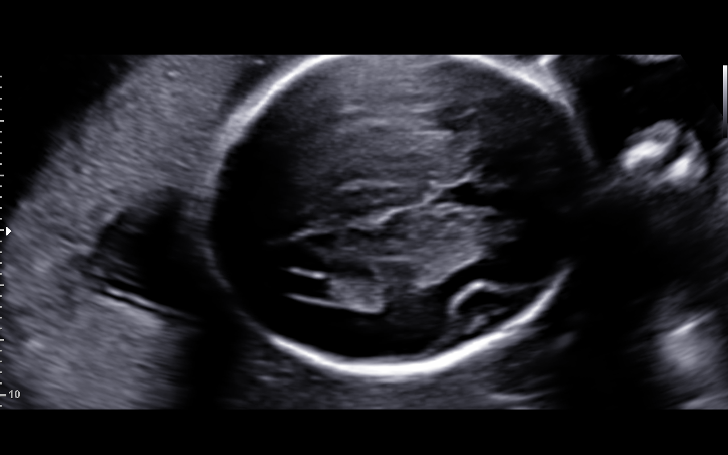
[im 70/119]
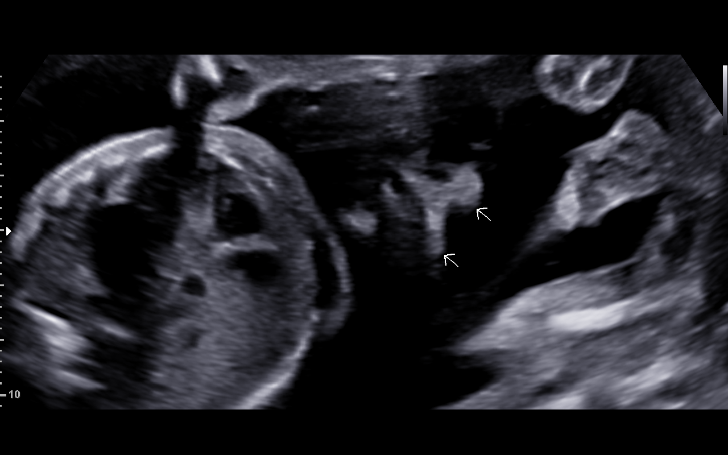
[im 79/119]
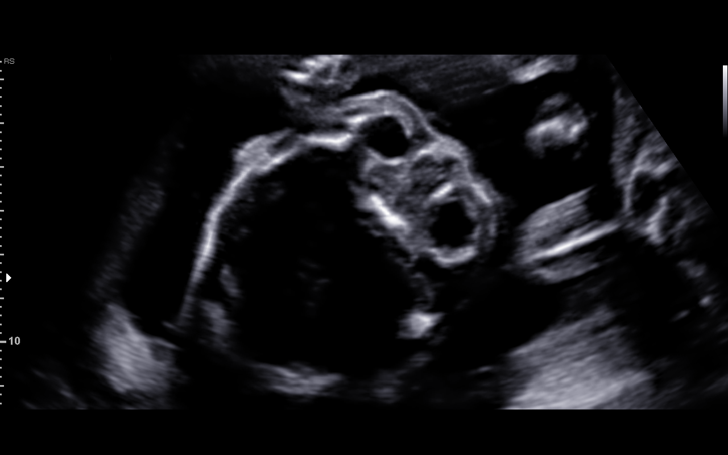
[im 88/119]
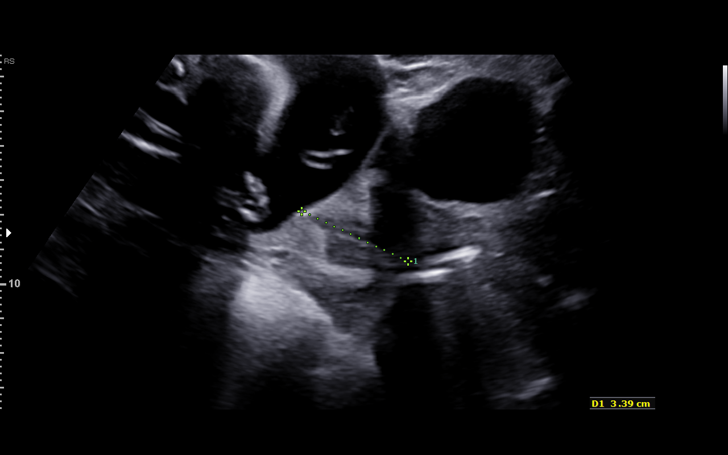
[im 97/119]
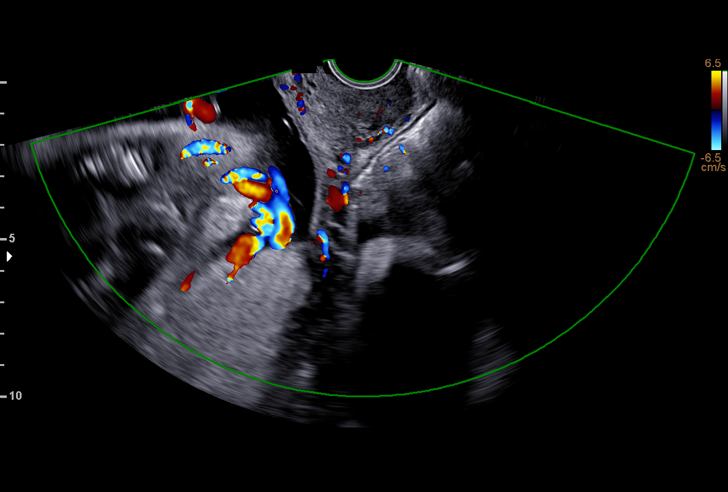
[im 105/119]
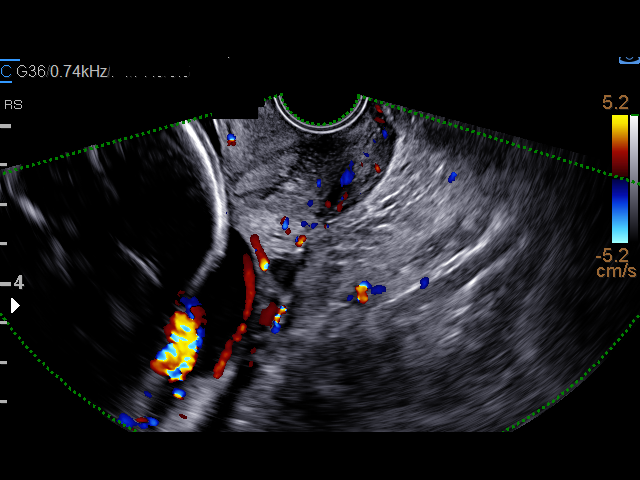
[im 114/119]
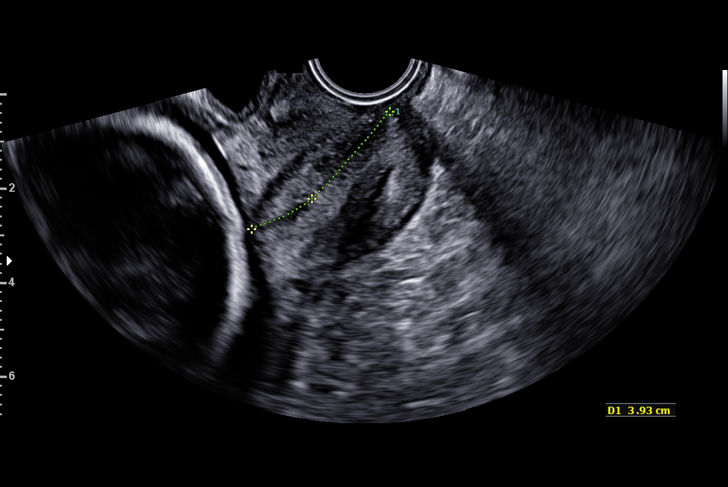

[13 of 28 positions shown; findings below may reference images not displayed]

1  SHAYLYN RIANO             [id number]      [id number]     [id number]
2  SHAYLYN RIANO             [id number]      [id number]     [id number]
Indications

23 weeks gestation of pregnancy
Encounter for antenatal screening for
malformations
Placental abnormality: (?velamentous CI
?vasa previa)
OB History

Gravidity:    2
TOP:          1        Living:  0
Fetal Evaluation

Num Of Fetuses:     1
Fetal Heart         158
Rate(bpm):
Cardiac Activity:   Observed
Presentation:       Variable
Placenta:           Right lateral/ post, above cervical os
P. Cord Insertion:  Velamentous insertion

Amniotic Fluid
AFI FV:      Subjectively within normal limits

Largest Pocket(cm)
6.13
Biometry
BPD:      58.9  mm     G. Age:  24w 0d         64  %    CI:        72.91   %    70 - 86
FL/HC:      19.1   %    18.7 -
HC:      219.3  mm     G. Age:  24w 0d         49  %    HC/AC:      1.13        1.05 -
AC:      194.3  mm     G. Age:  24w 1d         59  %    FL/BPD:     71.0   %    71 - 87
FL:       41.8  mm     G. Age:  23w 4d         39  %    FL/AC:      21.5   %    20 - 24
CER:      27.6  mm     G. Age:  24w 6d         76  %
CM:        4.4  mm

Est. FW:     642  gm      1 lb 7 oz     58  %
Gestational Age

LMP:           23w 4d        Date:  05/26/16                 EDD:   03/02/17
U/S Today:     24w 0d                                        EDD:   02/27/17
Best:          23w 4d     Det. By:  LMP  (05/26/16)          EDD:   03/02/17
Anatomy

Cranium:               Appears normal         Aortic Arch:            Appears normal
Cavum:                 Not well visualized    Ductal Arch:            Appears normal
Ventricles:            Appears normal         Diaphragm:              Appears normal
Choroid Plexus:        Not well visualized    Stomach:                Appears normal, left
sided
Cerebellum:            Appears normal         Abdomen:                Appears normal
Posterior Fossa:       Appears normal         Abdominal Wall:         Appears nml (cord
insert, abd wall)
Nuchal Fold:           Not applicable (>20    Cord Vessels:           Appears normal (3
wks GA)                                        vessel cord)
Face:                  Orbits appear          Kidneys:                Appear normal
normal
Lips:                  Appears normal         Bladder:                Appears normal
Thoracic:              Appears normal         Spine:                  Appears normal
Heart:                 Echogenic focus        Upper Extremities:      Appears normal
in LV
RVOT:                  Appears normal         Lower Extremities:      Appears normal
LVOT:                  Appears normal

Other:  Fetus appears to be a female. Heels visualized. Technically difficult
due to fetal position.
Cervix Uterus Adnexa

Cervix
Length:           3.93  cm.
Normal appearance by transvaginal scan

Uterus
No abnormality visualized.

Left Ovary
Within normal limits.

Right Ovary
Within normal limits.

Adnexa:       No abnormality visualized. No adnexal mass
visualized.
Comments

An echogenic focus was seen in the left cardiac ventricle.
However, no gross fetal anomalies or other soft markers of
aneuploidy were identified.   An echogenic intracardiac focus
is felt to represent a calcified papillary muscle, and is not
associated with structural or functional cardiac abnormalities.
Although an echogenic cardiac focus may be associated with
an increased risk of Down syndrome, this risk is felt to be
minimal, especially when it is seen as an isolated finding.
Impression

Single IUP at 23w 4d
An echogenic intracardiac focus is noted in the left ventricle
(see comments)
The remainder of the fetal anatomy appears normal
The estimated fetal weight is at the 58th %tile

TVUS - a right lateral/ posterior placenta is noted without
previa.  There appears to be a velamentous cord insertion
along the leading edge of the placenta. A funic presentation
is noted.  Several loops of cord course close to the cervix and
a single venous vessel is seen that courses across the cervix
(likely maternal).  No obvious unprotected fetal vessels are
seen along the internal os.  The cervical length is 3.93 cm.
Recommendations

Recommend follow up cervical length and reevaluation of the
fetal vessels in 2 and 4 weeks.
If there are concerns for possible vasa previa at that time,
would offer a course of Topp.

Will base further management decisions on ultrasound
findings over the next several weeks.  If there are truly
concerns for vasa previa, feel that she may continue
outpatient follow up until approximately 32 weeks (assuming
that a normal cervical length is appreciated).
If vasa previa is noted, would recommend a scheduled C-
section at 34 weeks in the absence of other complications.

## 2017-07-30 IMAGING — US US MFM OB TRANSVAGINAL
1 series · 16 of 28 positions shown · non-contrast
Comparison: none

[Series 1: us mfm ob transvaginal · 33 acquisitions, 16 frames shown]
[im 1/33]
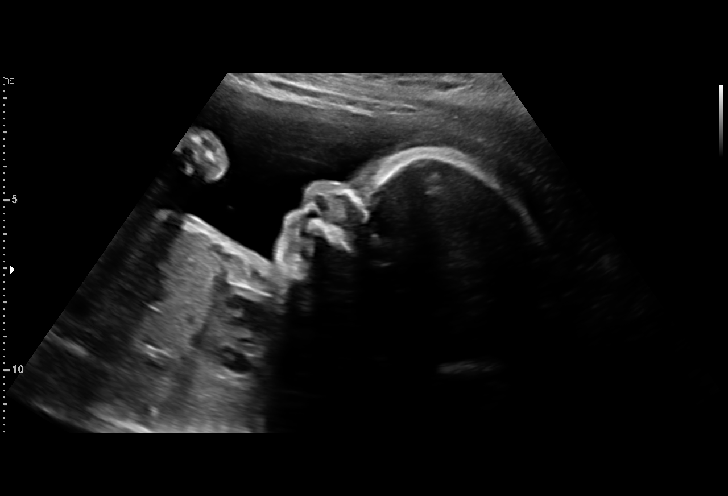
[im 3/33]
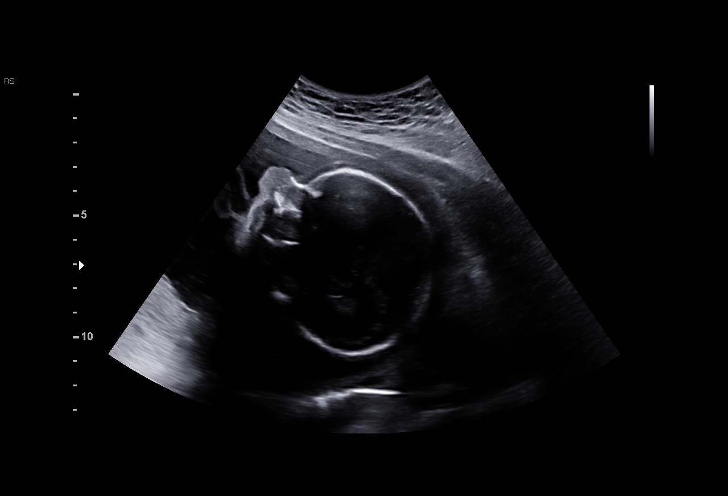
[im 5/33]
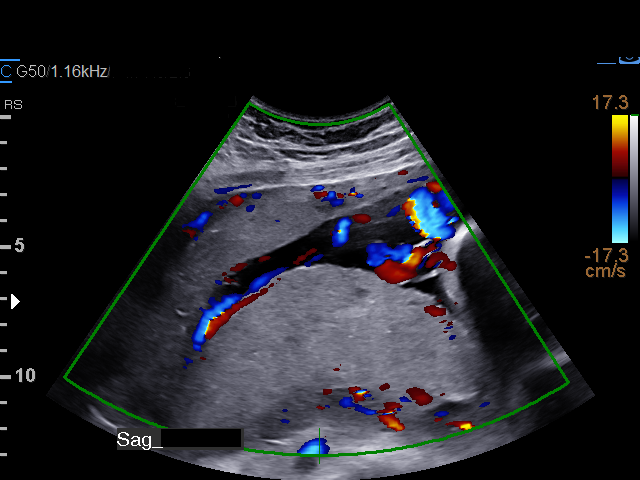
[im 8/33]
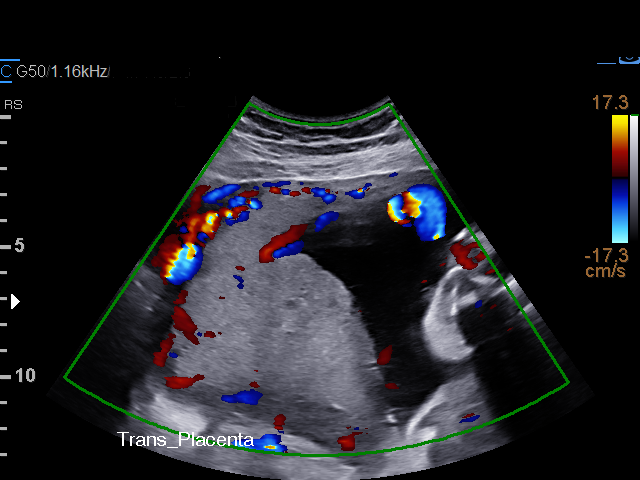
[im 9/33]
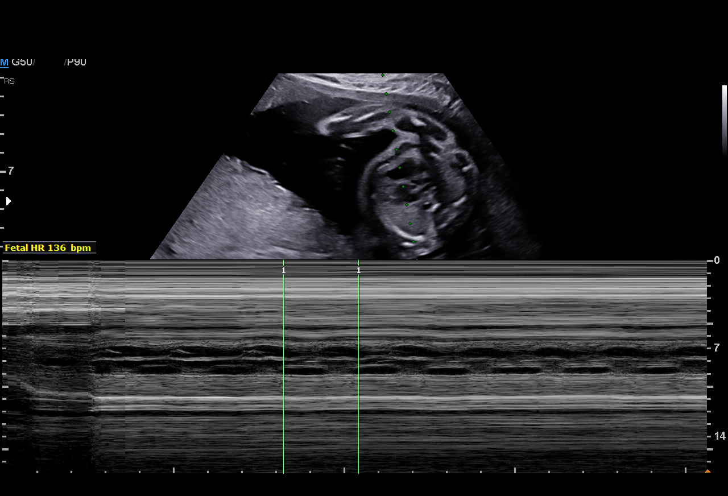
[im 11/33]
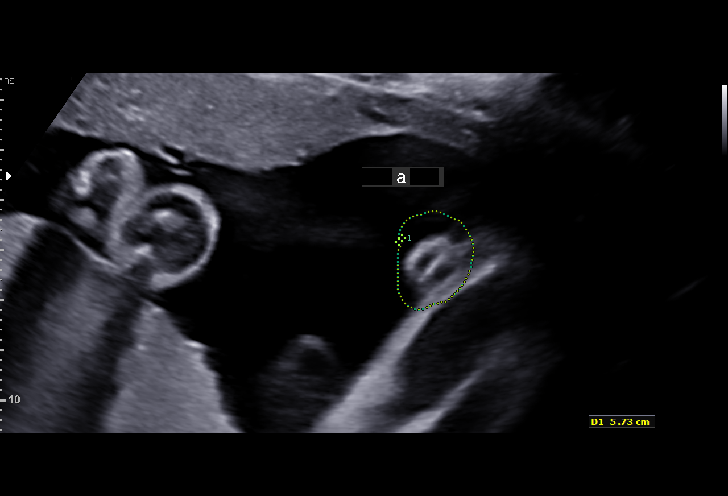
[im 14/33]
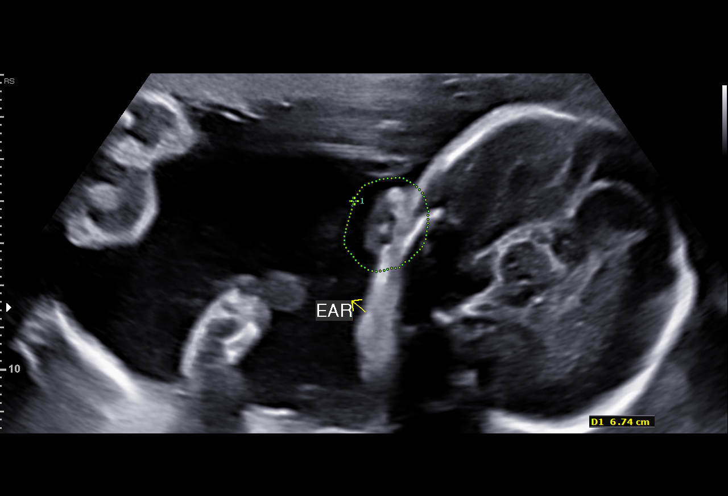
[im 16/33]
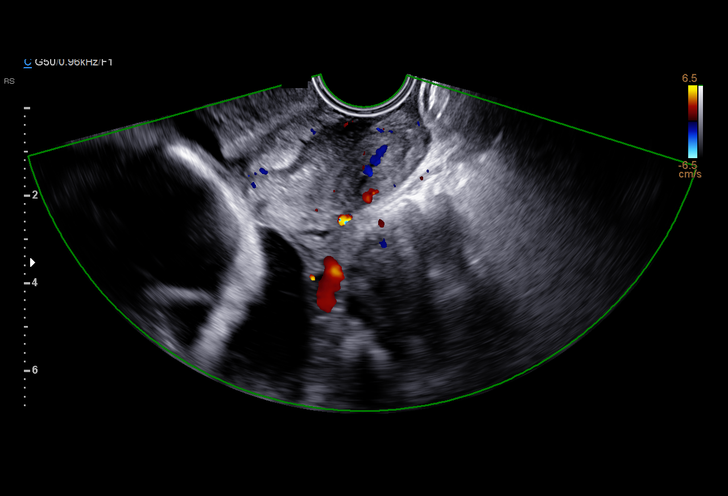
[im 17/33]
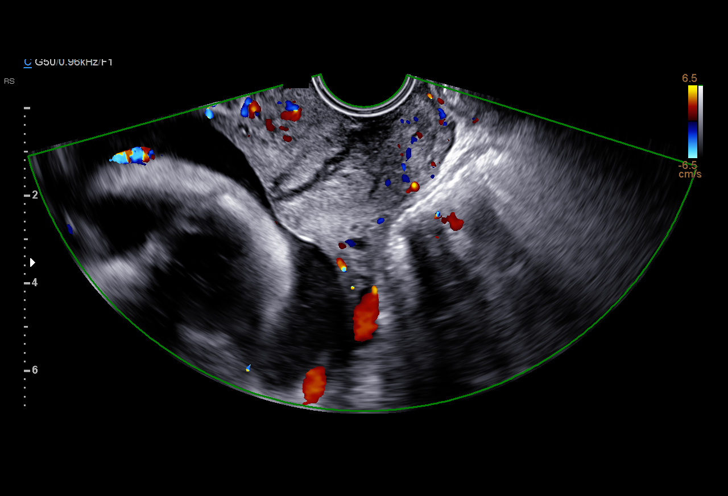
[im 19/33]
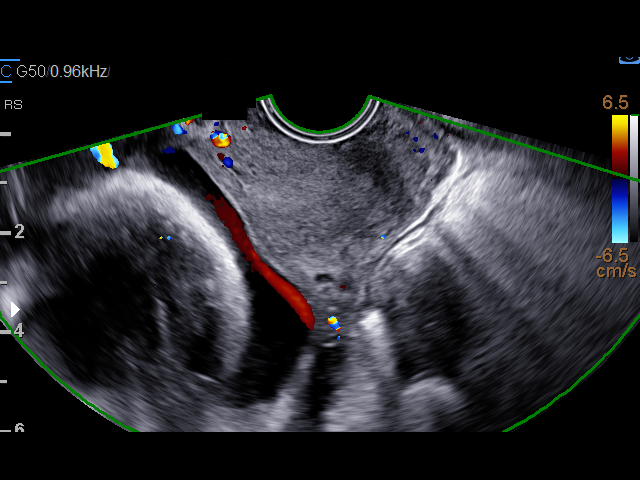
[im 22/33]
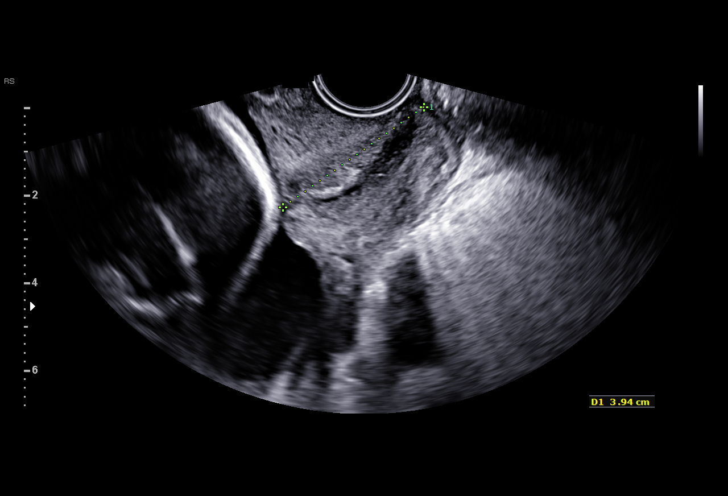
[im 24/33]
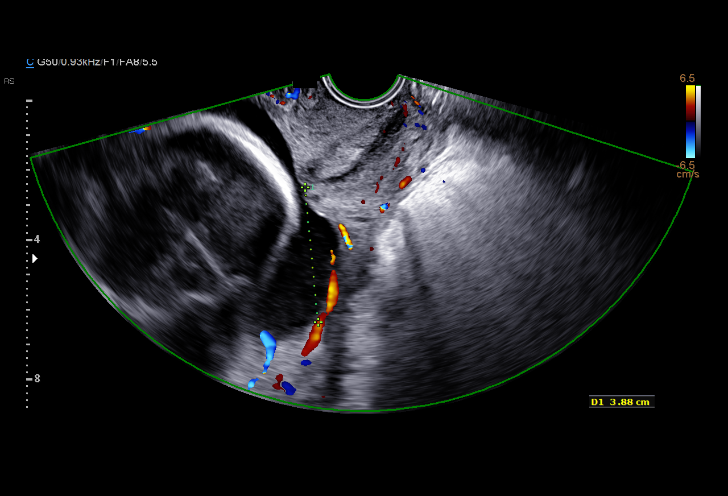
[im 25/33]
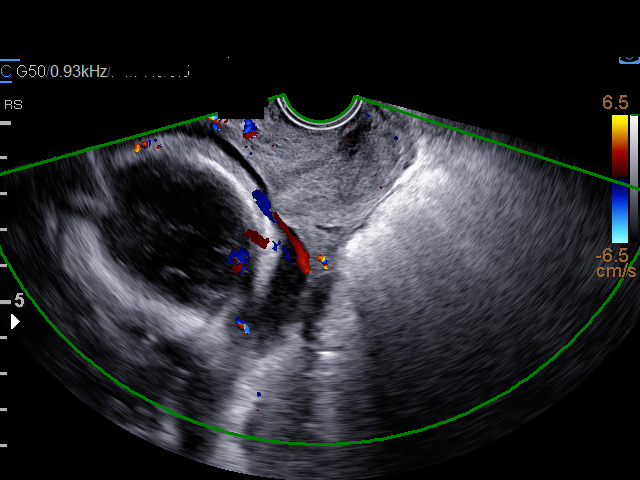
[im 28/33]
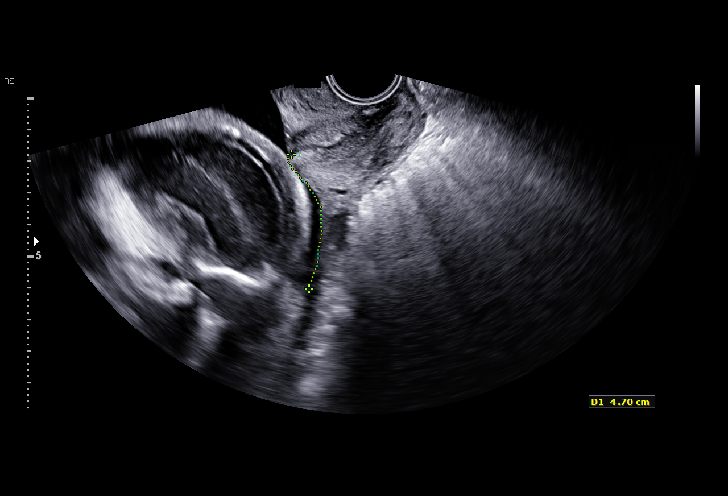
[im 30/33]
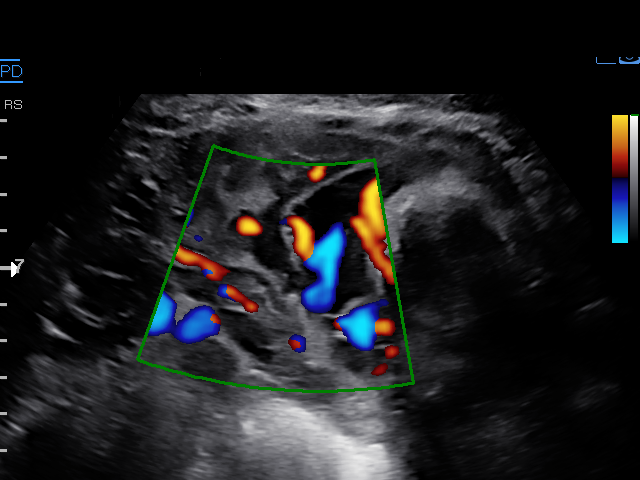
[im 33/33]
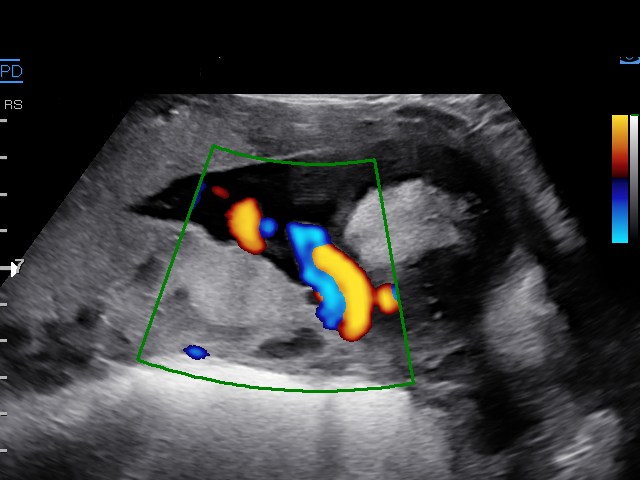

[16 of 28 positions shown; findings below may reference images not displayed]

1  NYA JUMPER            [id number]      [id number]     [id number]
Indications

25 weeks gestation of pregnancy
Encounter for cervical length
Placental abnormality: (?velamentous CI
?vasa previa)
Encounter for other antenatal screening
follow-up
OB History

Gravidity:    2
TOP:          1        Living:  0
Fetal Evaluation

Num Of Fetuses:     1
Cardiac Activity:   Observed
Presentation:       Cephalic
Placenta:           Right lateral/ post, above cervical os

Amniotic Fluid
AFI FV:      Subjectively within normal limits

Largest Pocket(cm)
6.25
Gestational Age

LMP:           25w 4d       Date:   05/26/16                 EDD:   03/02/17
Best:          25w 4d    Det. By:   LMP  (05/26/16)          EDD:   03/02/17
Cervix Uterus Adnexa
Cervix
Length:            3.9  cm.
Normal appearance by transvaginal scan
Impression

SIUP at 25+2 weeks
Normal amniotic fluid volume
Right lateral placenta with velamentous insertion site in lower
right uterus; no previa
EV views of cervix: normal length without funneling; no fetal
artery detected across cervix i.e. no vasa previa; vein
crossing adjacent to cervix

Recommendations

Keep US appt in 2 weeks for repeat evaluation

## 2017-09-26 IMAGING — US US MFM OB FOLLOW-UP
1 series · 13 of 28 positions shown · non-contrast
Comparison: none

[Series 1: us mfm ob follow-up · 13 of 76 slices shown]
[im 3/76]
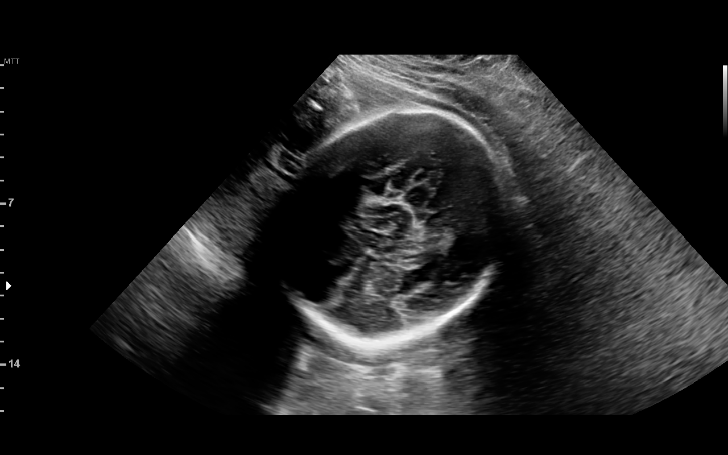
[im 9/76]
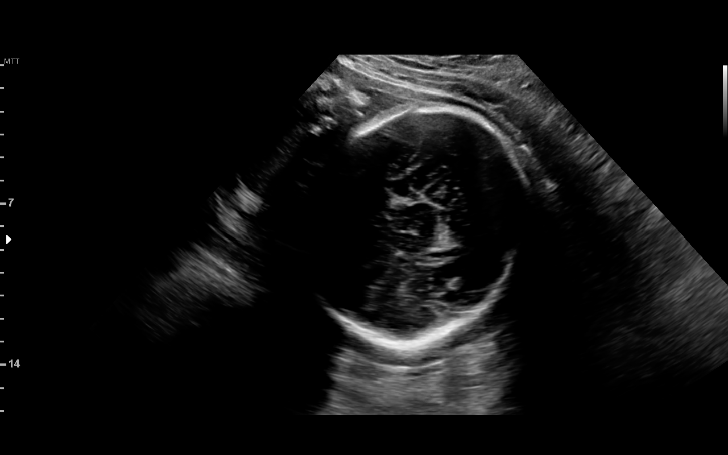
[im 14/76]
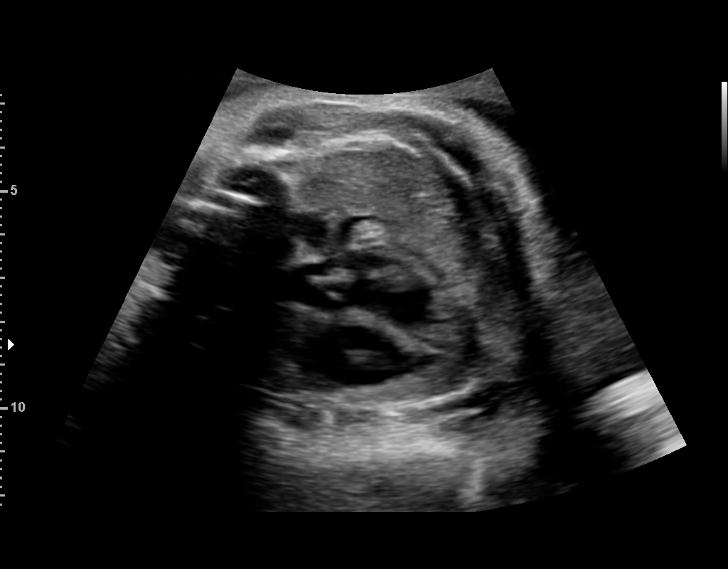
[im 20/76]
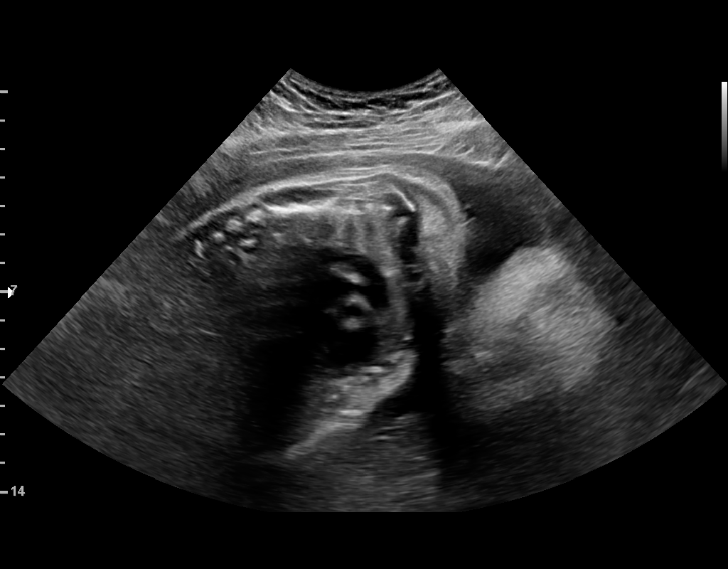
[im 26/76]
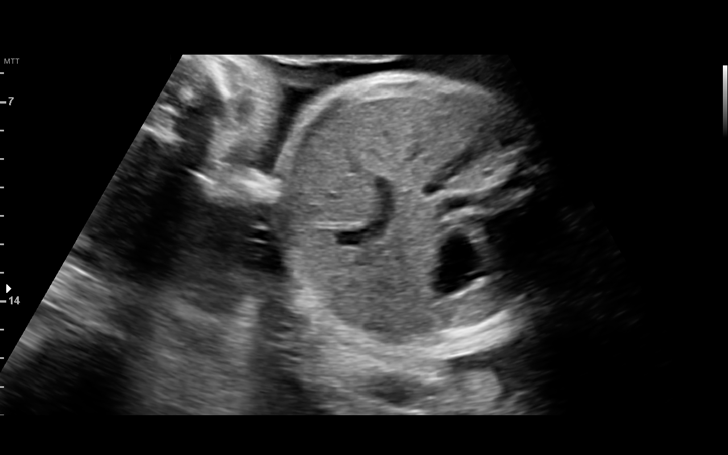
[im 31/76]
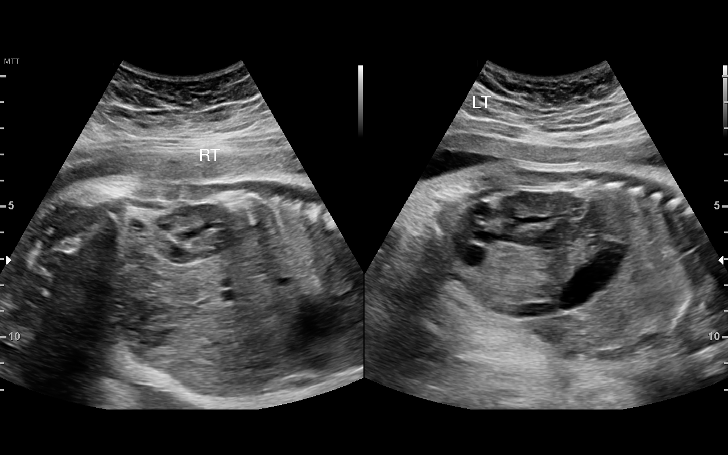
[im 39/76]
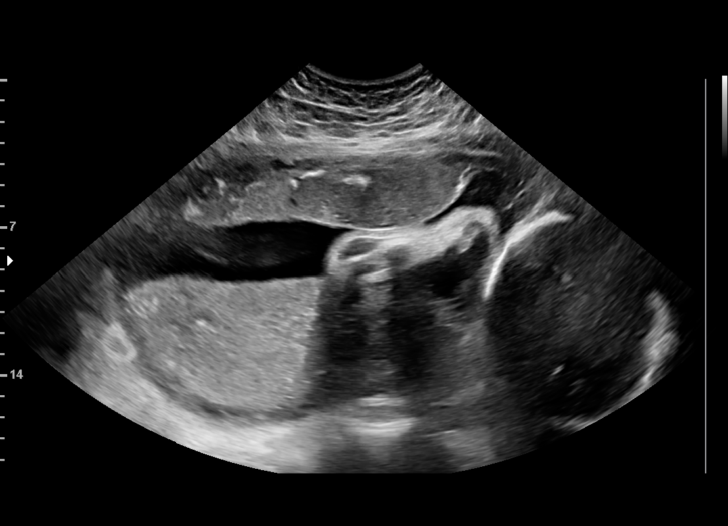
[im 45/76]
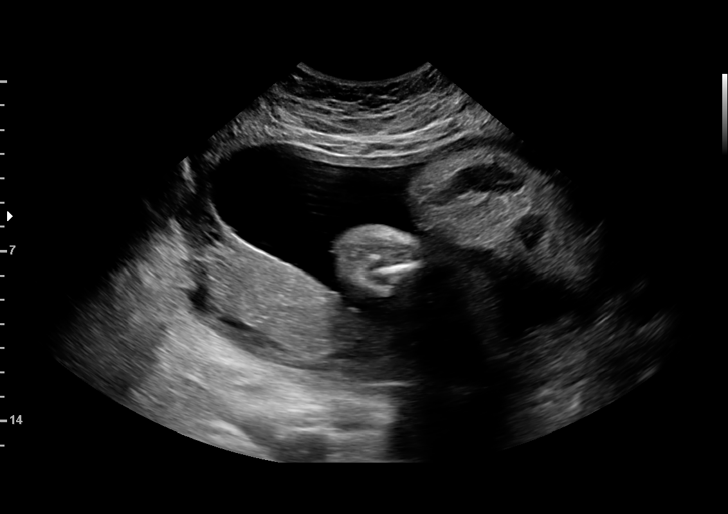
[im 51/76]
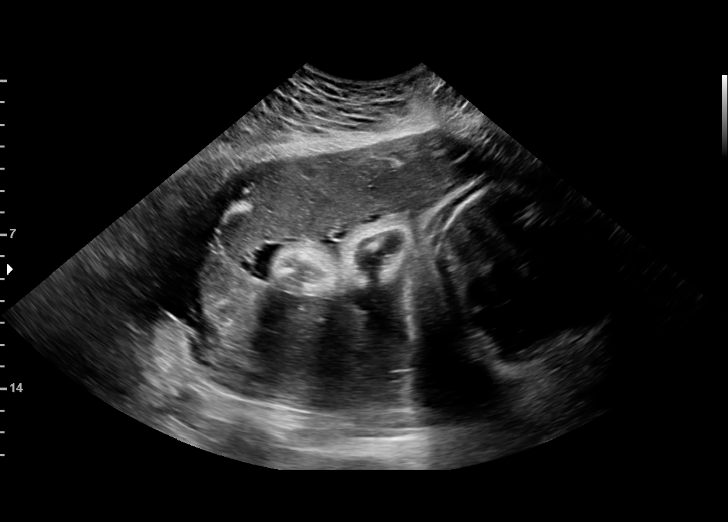
[im 56/76]
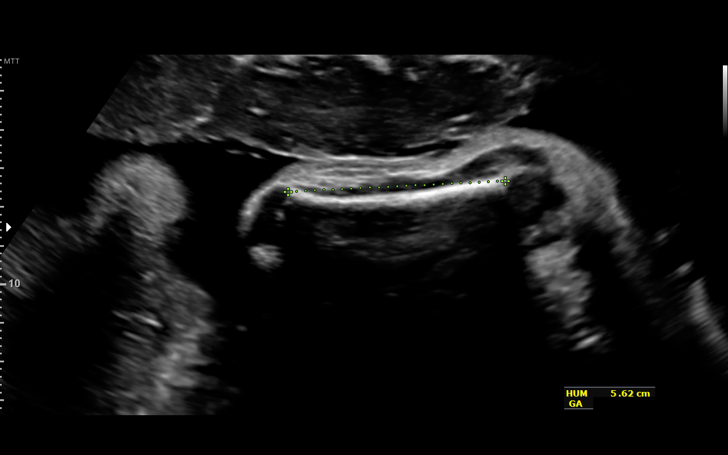
[im 62/76]
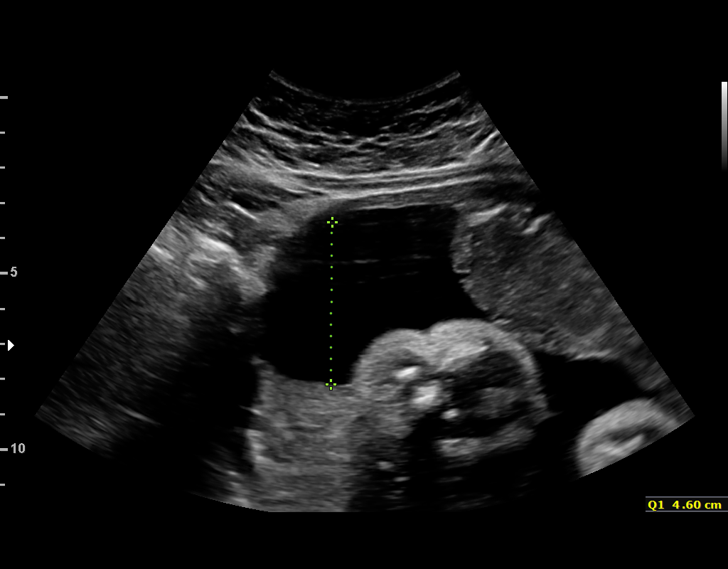
[im 67/76]
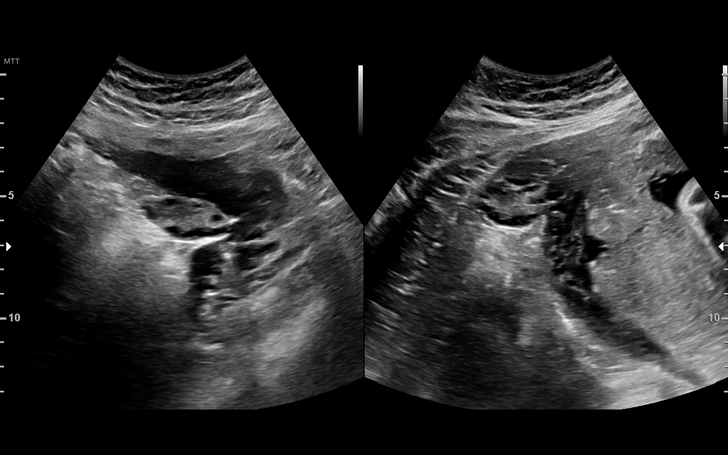
[im 73/76]
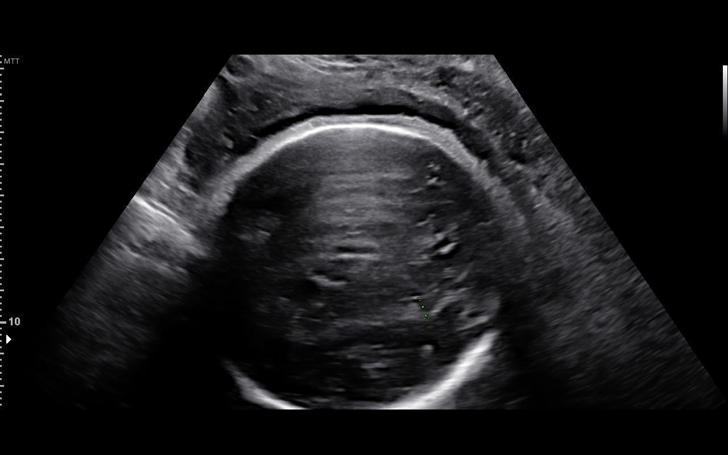

[13 of 28 positions shown; findings below may reference images not displayed]

1  LESBOURNE OSULLIVAN-ISAACS              [id number]      [id number]     [id number]
Indications

33 weeks gestation of pregnancy
Velamentous insertion of umbilical cord
Encounter for other antenatal screening
follow-up
Obesity complicating pregnancy, third
trimester
OB History

Blood Type:            Height:  5'9"   Weight (lb):  241      BMI:
Gravidity:    2         Term:   0        Prem:   0        SAB:   0
TOP:          1       Ectopic:  0        Living: 0
Fetal Evaluation

Num Of Fetuses:     1
Fetal Heart         161
Rate(bpm):
Cardiac Activity:   Observed
Presentation:       Cephalic
Placenta:           Right lateral, above cervical os
P. Cord Insertion:  Velamentous insertion

Amniotic Fluid
AFI FV:      Subjectively within normal limits

AFI Sum(cm)     %Tile       Largest Pocket(cm)
15.24           54

RUQ(cm)       RLQ(cm)       LUQ(cm)        LLQ(cm)
4.6
Biometry

BPD:      90.9  mm     G. Age:  36w 6d         99  %    CI:        84.05   %   70 - 86
FL/HC:      21.0   %   19.4 -
HC:      312.5  mm     G. Age:  35w 0d         43  %    HC/AC:      0.97       0.96 -
AC:      321.7  mm     G. Age:  36w 0d         96  %    FL/BPD:     72.2   %   71 - 87
FL:       65.6  mm     G. Age:  33w 6d         39  %    FL/AC:      20.4   %   20 - 24
HUM:      56.2  mm     G. Age:  32w 5d         33  %
CER:      45.2  mm     G. Age:  N/A          > 95  %

Est. FW:    3776  gm    5 lb 15 oz      83  %
Gestational Age

LMP:           33w 6d       Date:   05/26/16                 EDD:   03/02/17
U/S Today:     35w 3d                                        EDD:   02/19/17
Best:          33w 6d    Det. By:   LMP  (05/26/16)          EDD:   03/02/17
Anatomy

Cranium:               Appears normal         Aortic Arch:            Appears normal
Cavum:                 Previously seen        Ductal Arch:            Appears normal
Ventricles:            Appears normal         Diaphragm:              Appears normal
Choroid Plexus:        Previously seen        Stomach:                Appears normal, left
sided
Cerebellum:            Previously seen        Abdomen:                Previously seen
Posterior Fossa:       Previously seen        Abdominal Wall:         Previously seen
Nuchal Fold:           Not applicable (>20    Cord Vessels:           Previously seen
wks GA)
Face:                  Orbits and profile     Kidneys:                Appear normal
previously seen
Lips:                  Previously seen        Bladder:                Appears normal
Thoracic:              Previously seen        Spine:                  Previously seen
Heart:                 Appears normal         Upper Extremities:      Previously seen
(4CH, axis, and situs
RVOT:                  Appears normal         Lower Extremities:      Previously seen
LVOT:                  Appears normal

Other:  Female gender previously seen. Heels previously seen.  Technically
difficult due to advanced GA and fetal position.
Cervix Uterus Adnexa

Cervix
Not visualized (advanced GA >64wks)

Uterus
No abnormality visualized.

Left Ovary
Size(cm)     2.84  x    2.09   x  1.26      Vol(ml):
Within normal limits. No adnexal mass visualized.

Right Ovary
Size(cm)       3.1 x    2.08   x  1.58      Vol(ml):
Within normal limits. No adnexal mass visualized.

Cul De Sac:   No free fluid seen.

Adnexa:       No abnormality visualized.
Impression

SIUP at 33+6 weeks
Cephalic presentation
Normal amniotic fluid volume
Right lateral placenta with velamentous insertion site in lower
right uterus; no evidence of vasa previa
EFW is 2684g, the 83rd percentile for growth
Recommendations

Recommend repeat scan for growth in 4 weeks if undelivered

## 2019-09-05 NOTE — L&D Delivery Note (Signed)
Delivery Note:   G3P0010 at [redacted]w[redacted]d Admitting diagnosis: Encounter for induction of labor [Z34.90] Risks: BMI 34, BRCA2 positive, gallbladder colic w/neg sono findings controlled w/ diet mods, Rh neg / Rhogam given 02/09/2020, hx VCI & PPH with last pregnancy IUD conception, this pregnancy no placental abnormalities, S>D EFW 97% at 37 wks. Persistent perineal yeast.  Onset of labor: IOL at 10 am start - Cytotec PO IOL/Augmentation: AROM, Cytotec and OP Foley ROM: 1400 clear AF Into tub at 1715, intermittent BR breaks, OOT at 1912 for epidural Fentanyl 100 mcg IVP for pain control in interim.   Complete dilation at 04/27/2020  1952 Onset of pushing at 1952 FHR second stage cat 1  Analgesia /Anesthesia intrapartum: hydrotherapy, Epidural  Pushing in L lateral position with CNM and L&D staff support, JMerrily Pewpresent for birth and supportive.  Delivery of a Live born female  Birth Weight:  4131 g Weight:, English: 9 lb 1.7 oz APGAR: 9, 9  Newborn Delivery   Birth date/time: 04/27/2020 20:02:00 Delivery type: Vaginal, Spontaneous      in cephalic presentation, position OA to ROT.  APGAR:1 min-9 , 5 min-9  10 min-  Nuchal Cord: Yes x 1 Cord double clamped after cessation of pulsation, cut by Josh.  Collection of cord blood for typing completed. Cord blood donation-None  Arterial cord blood sample-No    Placenta delivered-Spontaneous  with 3 vessels . Uterotonics: Pitocin IV Placenta to L&D. Uterine tone firm, bleeding small  Small 1st deg perineal laceration identified. Hemostatic, declines repair. Episiotomy:None  Local analgesia: NA  Repair: NA Est. Blood Loss (mKW):409.73  Complications: None   Mom to postpartum.  Baby HHaynes Dageto Couplet care / Skin to Skin.  Delivery Report:  Review the Delivery Report for details.     Signed: DJuliene Pina CNM, MSN 04/27/2020, 8:51 PM

## 2019-09-09 DIAGNOSIS — Z20828 Contact with and (suspected) exposure to other viral communicable diseases: Secondary | ICD-10-CM | POA: Diagnosis not present

## 2019-09-09 DIAGNOSIS — U071 COVID-19: Secondary | ICD-10-CM | POA: Diagnosis not present

## 2019-09-23 DIAGNOSIS — Z3200 Encounter for pregnancy test, result unknown: Secondary | ICD-10-CM | POA: Diagnosis not present

## 2019-09-23 DIAGNOSIS — Z3490 Encounter for supervision of normal pregnancy, unspecified, unspecified trimester: Secondary | ICD-10-CM | POA: Diagnosis not present

## 2019-09-23 DIAGNOSIS — Z3689 Encounter for other specified antenatal screening: Secondary | ICD-10-CM | POA: Diagnosis not present

## 2019-09-29 DIAGNOSIS — Z3201 Encounter for pregnancy test, result positive: Secondary | ICD-10-CM | POA: Diagnosis not present

## 2019-10-20 DIAGNOSIS — Z3481 Encounter for supervision of other normal pregnancy, first trimester: Secondary | ICD-10-CM | POA: Diagnosis not present

## 2019-10-20 DIAGNOSIS — Z124 Encounter for screening for malignant neoplasm of cervix: Secondary | ICD-10-CM | POA: Diagnosis not present

## 2019-10-20 DIAGNOSIS — Z1151 Encounter for screening for human papillomavirus (HPV): Secondary | ICD-10-CM | POA: Diagnosis not present

## 2019-10-20 DIAGNOSIS — Z3682 Encounter for antenatal screening for nuchal translucency: Secondary | ICD-10-CM | POA: Diagnosis not present

## 2019-10-20 DIAGNOSIS — Z3689 Encounter for other specified antenatal screening: Secondary | ICD-10-CM | POA: Diagnosis not present

## 2019-10-20 LAB — OB RESULTS CONSOLE HIV ANTIBODY (ROUTINE TESTING): HIV: NONREACTIVE

## 2019-10-20 LAB — OB RESULTS CONSOLE RUBELLA ANTIBODY, IGM: Rubella: IMMUNE

## 2019-10-20 LAB — OB RESULTS CONSOLE HEPATITIS B SURFACE ANTIGEN: Hepatitis B Surface Ag: NEGATIVE

## 2019-10-21 LAB — OB RESULTS CONSOLE GC/CHLAMYDIA
Chlamydia: NEGATIVE
Gonorrhea: NEGATIVE

## 2019-10-28 DIAGNOSIS — Z3482 Encounter for supervision of other normal pregnancy, second trimester: Secondary | ICD-10-CM | POA: Diagnosis not present

## 2019-10-28 DIAGNOSIS — Z3483 Encounter for supervision of other normal pregnancy, third trimester: Secondary | ICD-10-CM | POA: Diagnosis not present

## 2019-11-17 DIAGNOSIS — R002 Palpitations: Secondary | ICD-10-CM | POA: Diagnosis not present

## 2019-11-21 DIAGNOSIS — Z3481 Encounter for supervision of other normal pregnancy, first trimester: Secondary | ICD-10-CM | POA: Diagnosis not present

## 2019-11-21 DIAGNOSIS — Z361 Encounter for antenatal screening for raised alphafetoprotein level: Secondary | ICD-10-CM | POA: Diagnosis not present

## 2019-12-12 DIAGNOSIS — R102 Pelvic and perineal pain: Secondary | ICD-10-CM | POA: Diagnosis not present

## 2019-12-12 DIAGNOSIS — O26899 Other specified pregnancy related conditions, unspecified trimester: Secondary | ICD-10-CM | POA: Diagnosis not present

## 2019-12-12 DIAGNOSIS — R350 Frequency of micturition: Secondary | ICD-10-CM | POA: Diagnosis not present

## 2019-12-15 DIAGNOSIS — Z363 Encounter for antenatal screening for malformations: Secondary | ICD-10-CM | POA: Diagnosis not present

## 2019-12-15 DIAGNOSIS — Z3482 Encounter for supervision of other normal pregnancy, second trimester: Secondary | ICD-10-CM | POA: Diagnosis not present

## 2020-01-12 DIAGNOSIS — N898 Other specified noninflammatory disorders of vagina: Secondary | ICD-10-CM | POA: Diagnosis not present

## 2020-01-12 DIAGNOSIS — Z3482 Encounter for supervision of other normal pregnancy, second trimester: Secondary | ICD-10-CM | POA: Diagnosis not present

## 2020-01-12 LAB — OB RESULTS CONSOLE RPR: RPR: NONREACTIVE

## 2020-02-04 DIAGNOSIS — Z3689 Encounter for other specified antenatal screening: Secondary | ICD-10-CM | POA: Diagnosis not present

## 2020-02-09 DIAGNOSIS — Z3483 Encounter for supervision of other normal pregnancy, third trimester: Secondary | ICD-10-CM | POA: Diagnosis not present

## 2020-02-09 DIAGNOSIS — Z3A28 28 weeks gestation of pregnancy: Secondary | ICD-10-CM | POA: Diagnosis not present

## 2020-02-09 DIAGNOSIS — O360130 Maternal care for anti-D [Rh] antibodies, third trimester, not applicable or unspecified: Secondary | ICD-10-CM | POA: Diagnosis not present

## 2020-02-09 DIAGNOSIS — Z3689 Encounter for other specified antenatal screening: Secondary | ICD-10-CM | POA: Diagnosis not present

## 2020-02-09 DIAGNOSIS — Z23 Encounter for immunization: Secondary | ICD-10-CM | POA: Diagnosis not present

## 2020-02-24 DIAGNOSIS — O360130 Maternal care for anti-D [Rh] antibodies, third trimester, not applicable or unspecified: Secondary | ICD-10-CM | POA: Diagnosis not present

## 2020-02-24 DIAGNOSIS — Z3A30 30 weeks gestation of pregnancy: Secondary | ICD-10-CM | POA: Diagnosis not present

## 2020-03-09 DIAGNOSIS — R1011 Right upper quadrant pain: Secondary | ICD-10-CM | POA: Diagnosis not present

## 2020-03-09 DIAGNOSIS — Z3483 Encounter for supervision of other normal pregnancy, third trimester: Secondary | ICD-10-CM | POA: Diagnosis not present

## 2020-03-10 ENCOUNTER — Other Ambulatory Visit: Payer: Self-pay

## 2020-03-10 DIAGNOSIS — K8020 Calculus of gallbladder without cholecystitis without obstruction: Secondary | ICD-10-CM

## 2020-03-10 DIAGNOSIS — K829 Disease of gallbladder, unspecified: Secondary | ICD-10-CM

## 2020-03-10 DIAGNOSIS — R1011 Right upper quadrant pain: Secondary | ICD-10-CM

## 2020-03-15 ENCOUNTER — Ambulatory Visit
Admission: RE | Admit: 2020-03-15 | Discharge: 2020-03-15 | Disposition: A | Discharge status: Home / Self Care | Payer: BC Managed Care – PPO | Source: Ambulatory Visit

## 2020-03-15 DIAGNOSIS — K8020 Calculus of gallbladder without cholecystitis without obstruction: Secondary | ICD-10-CM

## 2020-03-15 DIAGNOSIS — K829 Disease of gallbladder, unspecified: Secondary | ICD-10-CM

## 2020-03-15 DIAGNOSIS — R1011 Right upper quadrant pain: Secondary | ICD-10-CM | POA: Diagnosis not present

## 2020-03-24 DIAGNOSIS — Z3A34 34 weeks gestation of pregnancy: Secondary | ICD-10-CM | POA: Diagnosis not present

## 2020-03-24 DIAGNOSIS — O360130 Maternal care for anti-D [Rh] antibodies, third trimester, not applicable or unspecified: Secondary | ICD-10-CM | POA: Diagnosis not present

## 2020-04-05 DIAGNOSIS — O99213 Obesity complicating pregnancy, third trimester: Secondary | ICD-10-CM | POA: Diagnosis not present

## 2020-04-05 DIAGNOSIS — Z3A36 36 weeks gestation of pregnancy: Secondary | ICD-10-CM | POA: Diagnosis not present

## 2020-04-12 DIAGNOSIS — O99213 Obesity complicating pregnancy, third trimester: Secondary | ICD-10-CM | POA: Diagnosis not present

## 2020-04-12 DIAGNOSIS — Z3685 Encounter for antenatal screening for Streptococcus B: Secondary | ICD-10-CM | POA: Diagnosis not present

## 2020-04-12 DIAGNOSIS — Z3A37 37 weeks gestation of pregnancy: Secondary | ICD-10-CM | POA: Diagnosis not present

## 2020-04-12 LAB — OB RESULTS CONSOLE GBS: GBS: NEGATIVE

## 2020-04-13 ENCOUNTER — Encounter (HOSPITAL_COMMUNITY): Payer: Self-pay | Admitting: *Deleted

## 2020-04-13 ENCOUNTER — Telehealth (HOSPITAL_COMMUNITY): Payer: Self-pay | Admitting: *Deleted

## 2020-04-13 NOTE — Telephone Encounter (Signed)
Preadmission screen  

## 2020-04-19 DIAGNOSIS — Z3A38 38 weeks gestation of pregnancy: Secondary | ICD-10-CM | POA: Diagnosis not present

## 2020-04-19 DIAGNOSIS — O99213 Obesity complicating pregnancy, third trimester: Secondary | ICD-10-CM | POA: Diagnosis not present

## 2020-04-21 ENCOUNTER — Other Ambulatory Visit: Payer: Self-pay

## 2020-04-22 ENCOUNTER — Other Ambulatory Visit (HOSPITAL_COMMUNITY)
Admission: RE | Admit: 2020-04-22 | Discharge: 2020-04-22 | Disposition: A | Discharge status: Home / Self Care | Payer: BC Managed Care – PPO | Source: Ambulatory Visit | Attending: Obstetrics and Gynecology | Admitting: Obstetrics and Gynecology

## 2020-04-22 DIAGNOSIS — Z01812 Encounter for preprocedural laboratory examination: Secondary | ICD-10-CM | POA: Insufficient documentation

## 2020-04-22 DIAGNOSIS — Z20822 Contact with and (suspected) exposure to covid-19: Secondary | ICD-10-CM | POA: Insufficient documentation

## 2020-04-22 LAB — SARS CORONAVIRUS 2 (TAT 6-24 HRS): SARS Coronavirus 2: NEGATIVE

## 2020-04-23 DIAGNOSIS — O99213 Obesity complicating pregnancy, third trimester: Secondary | ICD-10-CM | POA: Diagnosis not present

## 2020-04-23 DIAGNOSIS — Z3A38 38 weeks gestation of pregnancy: Secondary | ICD-10-CM | POA: Diagnosis not present

## 2020-04-24 ENCOUNTER — Inpatient Hospital Stay (HOSPITAL_COMMUNITY): Payer: BC Managed Care – PPO

## 2020-04-26 DIAGNOSIS — M5489 Other dorsalgia: Secondary | ICD-10-CM | POA: Diagnosis not present

## 2020-04-27 ENCOUNTER — Inpatient Hospital Stay (HOSPITAL_COMMUNITY): Payer: BC Managed Care – PPO

## 2020-04-27 ENCOUNTER — Inpatient Hospital Stay (HOSPITAL_COMMUNITY): Payer: BC Managed Care – PPO | Admitting: Anesthesiology

## 2020-04-27 ENCOUNTER — Encounter (HOSPITAL_COMMUNITY): Payer: Self-pay

## 2020-04-27 ENCOUNTER — Inpatient Hospital Stay (HOSPITAL_COMMUNITY)
Admission: AD | Admit: 2020-04-27 | Discharge: 2020-04-29 | DRG: 806 | Disposition: A | Discharge status: Home / Self Care | Payer: BC Managed Care – PPO | Attending: Obstetrics and Gynecology | Admitting: Obstetrics and Gynecology

## 2020-04-27 ENCOUNTER — Other Ambulatory Visit: Payer: Self-pay

## 2020-04-27 DIAGNOSIS — O9882 Other maternal infectious and parasitic diseases complicating childbirth: Secondary | ICD-10-CM | POA: Diagnosis not present

## 2020-04-27 DIAGNOSIS — D509 Iron deficiency anemia, unspecified: Secondary | ICD-10-CM | POA: Diagnosis not present

## 2020-04-27 DIAGNOSIS — E669 Obesity, unspecified: Secondary | ICD-10-CM | POA: Diagnosis not present

## 2020-04-27 DIAGNOSIS — D649 Anemia, unspecified: Secondary | ICD-10-CM | POA: Diagnosis not present

## 2020-04-27 DIAGNOSIS — O99214 Obesity complicating childbirth: Secondary | ICD-10-CM | POA: Diagnosis present

## 2020-04-27 DIAGNOSIS — O26893 Other specified pregnancy related conditions, third trimester: Secondary | ICD-10-CM | POA: Diagnosis present

## 2020-04-27 DIAGNOSIS — Z6791 Unspecified blood type, Rh negative: Secondary | ICD-10-CM | POA: Diagnosis not present

## 2020-04-27 DIAGNOSIS — B373 Candidiasis of vulva and vagina: Secondary | ICD-10-CM | POA: Diagnosis not present

## 2020-04-27 DIAGNOSIS — O9902 Anemia complicating childbirth: Principal | ICD-10-CM | POA: Diagnosis present

## 2020-04-27 DIAGNOSIS — Z3A39 39 weeks gestation of pregnancy: Secondary | ICD-10-CM

## 2020-04-27 DIAGNOSIS — Z3490 Encounter for supervision of normal pregnancy, unspecified, unspecified trimester: Secondary | ICD-10-CM | POA: Diagnosis present

## 2020-04-27 LAB — TYPE AND SCREEN
ABO/RH(D): O NEG
Antibody Screen: NEGATIVE

## 2020-04-27 LAB — CBC
HCT: 32.4 % — ABNORMAL LOW (ref 36.0–46.0)
Hemoglobin: 10.1 g/dL — ABNORMAL LOW (ref 12.0–15.0)
MCH: 26.2 pg (ref 26.0–34.0)
MCHC: 31.2 g/dL (ref 30.0–36.0)
MCV: 83.9 fL (ref 80.0–100.0)
Platelets: 232 10*3/uL (ref 150–400)
RBC: 3.86 MIL/uL — ABNORMAL LOW (ref 3.87–5.11)
RDW: 14.6 % (ref 11.5–15.5)
WBC: 12.8 10*3/uL — ABNORMAL HIGH (ref 4.0–10.5)
nRBC: 0 % (ref 0.0–0.2)

## 2020-04-27 MED ORDER — METHYLERGONOVINE MALEATE 0.2 MG PO TABS: 0.2000 mg | ORAL_TABLET | ORAL | Status: DC | PRN

## 2020-04-27 MED ORDER — DIBUCAINE (PERIANAL) 1 % EX OINT: 1.0000 "application " | TOPICAL_OINTMENT | CUTANEOUS | Status: DC | PRN

## 2020-04-27 MED ORDER — EPHEDRINE 5 MG/ML INJ: 10.0000 mg | INTRAVENOUS | Status: DC | PRN

## 2020-04-27 MED ORDER — LIDOCAINE HCL (PF) 1 % IJ SOLN
INTRAMUSCULAR | Status: DC | PRN
  Administered 2020-04-27 (×2): 5 mL via EPIDURAL

## 2020-04-27 MED ORDER — MISOPROSTOL 50MCG HALF TABLET
50.0000 ug | ORAL_TABLET | ORAL | Status: DC | PRN
  Administered 2020-04-27: 50 ug via BUCCAL
  Filled 2020-04-27: qty 1

## 2020-04-27 MED ORDER — SODIUM CHLORIDE (PF) 0.9 % IJ SOLN
INTRAMUSCULAR | Status: DC | PRN
Start: 2020-04-27 — End: 2020-04-27
  Administered 2020-04-27: 13 mL/h via EPIDURAL

## 2020-04-27 MED ORDER — SIMETHICONE 80 MG PO CHEW: 80.0000 mg | CHEWABLE_TABLET | ORAL | Status: DC | PRN

## 2020-04-27 MED ORDER — COCONUT OIL OIL: 1.0000 "application " | TOPICAL_OIL | Status: DC | PRN

## 2020-04-27 MED ORDER — ONDANSETRON HCL 4 MG/2ML IJ SOLN: 4.0000 mg | Freq: Four times a day (QID) | INTRAMUSCULAR | Status: DC | PRN

## 2020-04-27 MED ORDER — DIPHENHYDRAMINE HCL 25 MG PO CAPS: 25.0000 mg | ORAL_CAPSULE | Freq: Four times a day (QID) | ORAL | Status: DC | PRN

## 2020-04-27 MED ORDER — FLEET ENEMA 7-19 GM/118ML RE ENEM: 1.0000 | ENEMA | Freq: Every day | RECTAL | Status: DC | PRN

## 2020-04-27 MED ORDER — ZOLPIDEM TARTRATE 5 MG PO TABS: 5.0000 mg | ORAL_TABLET | Freq: Every evening | ORAL | Status: DC | PRN

## 2020-04-27 MED ORDER — OXYTOCIN-SODIUM CHLORIDE 30-0.9 UT/500ML-% IV SOLN
INTRAVENOUS | Status: AC
  Filled 2020-04-27: qty 500

## 2020-04-27 MED ORDER — NYSTATIN-TRIAMCINOLONE 100000-0.1 UNIT/GM-% EX OINT
TOPICAL_OINTMENT | Freq: Two times a day (BID) | CUTANEOUS | Status: DC
  Filled 2020-04-27: qty 15

## 2020-04-27 MED ORDER — TERBUTALINE SULFATE 1 MG/ML IJ SOLN: 0.2500 mg | Freq: Once | INTRAMUSCULAR | Status: DC | PRN

## 2020-04-27 MED ORDER — ACETAMINOPHEN 325 MG PO TABS: 650.0000 mg | ORAL_TABLET | ORAL | Status: DC | PRN

## 2020-04-27 MED ORDER — TETANUS-DIPHTH-ACELL PERTUSSIS 5-2.5-18.5 LF-MCG/0.5 IM SUSP: 0.5000 mL | Freq: Once | INTRAMUSCULAR | Status: DC

## 2020-04-27 MED ORDER — FENTANYL-BUPIVACAINE-NACL 0.5-0.125-0.9 MG/250ML-% EP SOLN: 12.0000 mL/h | EPIDURAL | Status: DC | PRN

## 2020-04-27 MED ORDER — PRENATAL MULTIVITAMIN CH
1.0000 | ORAL_TABLET | Freq: Every day | ORAL | Status: DC
  Administered 2020-04-28 – 2020-04-29 (×2): 1 via ORAL
  Filled 2020-04-27 (×2): qty 1

## 2020-04-27 MED ORDER — OXYTOCIN 10 UNIT/ML IJ SOLN
10.0000 [IU] | Freq: Once | INTRAMUSCULAR | Status: DC
  Filled 2020-04-27: qty 1

## 2020-04-27 MED ORDER — IBUPROFEN 600 MG PO TABS
600.0000 mg | ORAL_TABLET | Freq: Four times a day (QID) | ORAL | Status: DC
  Administered 2020-04-27 – 2020-04-29 (×6): 600 mg via ORAL
  Filled 2020-04-27 (×7): qty 1

## 2020-04-27 MED ORDER — ACETAMINOPHEN 500 MG PO TABS
1000.0000 mg | ORAL_TABLET | Freq: Four times a day (QID) | ORAL | Status: DC
  Administered 2020-04-27 – 2020-04-29 (×6): 1000 mg via ORAL
  Filled 2020-04-27 (×7): qty 2

## 2020-04-27 MED ORDER — PHENYLEPHRINE 40 MCG/ML (10ML) SYRINGE FOR IV PUSH (FOR BLOOD PRESSURE SUPPORT): 80.0000 ug | PREFILLED_SYRINGE | INTRAVENOUS | Status: DC | PRN

## 2020-04-27 MED ORDER — LIDOCAINE HCL (PF) 1 % IJ SOLN: 30.0000 mL | INTRAMUSCULAR | Status: DC | PRN

## 2020-04-27 MED ORDER — METHYLERGONOVINE MALEATE 0.2 MG/ML IJ SOLN: 0.2000 mg | INTRAMUSCULAR | Status: DC | PRN

## 2020-04-27 MED ORDER — ONDANSETRON HCL 4 MG/2ML IJ SOLN: 4.0000 mg | INTRAMUSCULAR | Status: DC | PRN

## 2020-04-27 MED ORDER — SENNOSIDES-DOCUSATE SODIUM 8.6-50 MG PO TABS
2.0000 | ORAL_TABLET | ORAL | Status: DC
  Administered 2020-04-27 – 2020-04-28 (×2): 2 via ORAL
  Filled 2020-04-27 (×2): qty 2

## 2020-04-27 MED ORDER — FLUCONAZOLE 150 MG PO TABS
150.0000 mg | ORAL_TABLET | Freq: Once | ORAL | Status: AC
  Administered 2020-04-27: 150 mg via ORAL
  Filled 2020-04-27: qty 1

## 2020-04-27 MED ORDER — ONDANSETRON HCL 4 MG PO TABS: 4.0000 mg | ORAL_TABLET | ORAL | Status: DC | PRN

## 2020-04-27 MED ORDER — OXYTOCIN-SODIUM CHLORIDE 30-0.9 UT/500ML-% IV SOLN
2.5000 [IU]/h | INTRAVENOUS | Status: DC
  Administered 2020-04-27: 2.5 [IU]/h via INTRAVENOUS

## 2020-04-27 MED ORDER — WITCH HAZEL-GLYCERIN EX PADS: 1.0000 "application " | MEDICATED_PAD | CUTANEOUS | Status: DC | PRN

## 2020-04-27 MED ORDER — LACTATED RINGERS IV SOLN
500.0000 mL | Freq: Once | INTRAVENOUS | Status: AC
  Administered 2020-04-27: 500 mL via INTRAVENOUS

## 2020-04-27 MED ORDER — BENZOCAINE-MENTHOL 20-0.5 % EX AERO
1.0000 "application " | INHALATION_SPRAY | CUTANEOUS | Status: DC | PRN
  Administered 2020-04-27: 1 via TOPICAL
  Filled 2020-04-27: qty 56

## 2020-04-27 MED ORDER — FENTANYL-BUPIVACAINE-NACL 0.5-0.125-0.9 MG/250ML-% EP SOLN
EPIDURAL | Status: AC
  Filled 2020-04-27: qty 250

## 2020-04-27 MED ORDER — FENTANYL CITRATE (PF) 100 MCG/2ML IJ SOLN
INTRAMUSCULAR | Status: AC
Start: 2020-04-27 — End: 2020-04-28
  Filled 2020-04-27: qty 2

## 2020-04-27 MED ORDER — SOD CITRATE-CITRIC ACID 500-334 MG/5ML PO SOLN: 30.0000 mL | ORAL | Status: DC | PRN

## 2020-04-27 MED ORDER — DIPHENHYDRAMINE HCL 50 MG/ML IJ SOLN: 12.5000 mg | INTRAMUSCULAR | Status: DC | PRN

## 2020-04-27 MED ORDER — FENTANYL CITRATE (PF) 100 MCG/2ML IJ SOLN
100.0000 ug | Freq: Once | INTRAMUSCULAR | Status: AC
  Administered 2020-04-27: 100 ug via INTRAVENOUS

## 2020-04-27 MED ORDER — BISACODYL 10 MG RE SUPP: 10.0000 mg | Freq: Every day | RECTAL | Status: DC | PRN

## 2020-04-27 NOTE — Anesthesia Preprocedure Evaluation (Signed)
Anesthesia Evaluation  Patient identified by MRN, date of birth, ID band Patient awake    Reviewed: Allergy & Precautions, Patient's Chart, lab work & pertinent test results  Airway Mallampati: II  TM Distance: >3 FB Neck ROM: Full    Dental no notable dental hx. (+) Teeth Intact   Pulmonary neg pulmonary ROS,    Pulmonary exam normal breath sounds clear to auscultation       Cardiovascular Normal cardiovascular exam+ dysrhythmias Supra Ventricular Tachycardia  Rhythm:Regular Rate:Normal     Neuro/Psych negative neurological ROS  negative psych ROS   GI/Hepatic Neg liver ROS, GERD  ,  Endo/Other  Obesity  BRCA2 gene carrier  Renal/GU negative Renal ROS  negative genitourinary   Musculoskeletal negative musculoskeletal ROS (+)   Abdominal (+) + obese,   Peds  Hematology  (+) anemia ,   Anesthesia Other Findings   Reproductive/Obstetrics (+) Pregnancy                             Anesthesia Physical Anesthesia Plan  ASA: II  Anesthesia Plan: Epidural   Post-op Pain Management:    Induction:   PONV Risk Score and Plan:   Airway Management Planned: Natural Airway  Additional Equipment:   Intra-op Plan:   Post-operative Plan:   Informed Consent: I have reviewed the patients History and Physical, chart, labs and discussed the procedure including the risks, benefits and alternatives for the proposed anesthesia with the patient or authorized representative who has indicated his/her understanding and acceptance.       Plan Discussed with: Anesthesiologist  Anesthesia Plan Comments:         Anesthesia Quick Evaluation

## 2020-04-27 NOTE — Progress Notes (Signed)
[name] is a 34 y.o. G3P0010 at [redacted]w[redacted]d by LMP admitted for term IOL  Subjective: S/P cytotec PO x 1, not feeling much discomfort w/ ctx.  Discussed augmentation, agreeable to AROM.   Objective: Vitals:   04/27/20 0819 04/27/20 0829 04/27/20 0930 04/27/20 1231  BP: 118/64  108/83 113/65  Pulse: (!) 123  (!) 123 98  Resp: 17   18  Temp: 98.4 F (36.9 C)   98.4 F (36.9 C)  TempSrc: Oral   Oral  Weight:  120.1 kg    Height:  5\' 9"  (1.753 m)       FHT:  FHR: 130 bpm, variability: moderate, no decels noted on  Intermittent EFM  UC:   irregular SVE:   Dilation: 4 Effacement (%): 70 Station: -1 Exam by:: [email or web address], CNM AROM clear AF, large amount  Labs:   Recent Labs    04/27/20 1006  WBC 12.8*  HGB 10.1*  HCT 32.4*  PLT 232    Assessment / Plan: G3P0010 34 y.o. [redacted]w[redacted]d Induction of labor due to term with favorable cervix,  progressing well on pitocin  Labor: AROM augmentation, may use hydrotherapy with active labor Preeclampsia:  no signs or symptoms of toxicity Fetal Wellbeing:  Category I Pain Control:  Labor support without medications I/D:  GBS neg Anticipated MOD:  NSVD  [redacted]w[redacted]d, CNM, MSN 04/27/2020, 2:02 PM

## 2020-04-27 NOTE — Anesthesia Procedure Notes (Signed)
Epidural Patient location during procedure: OB Start time: 04/27/2020 7:42 PM End time: 04/27/2020 7:49 PM  Staffing Anesthesiologist: Mal Amabile, MD Performed: anesthesiologist   Preanesthetic Checklist Completed: patient identified, IV checked, site marked, risks and benefits discussed, surgical consent, monitors and equipment checked, pre-op evaluation and timeout performed  Epidural Patient position: sitting Prep: DuraPrep and site prepped and draped Patient monitoring: continuous pulse ox and blood pressure Approach: midline Location: L3-L4 Injection technique: LOR air  Needle:  Needle type: Tuohy  Needle gauge: 17 G Needle length: 9 cm and 9 Needle insertion depth: 6 cm Catheter type: closed end flexible Catheter size: 19 Gauge Catheter at skin depth: 11 cm Test dose: negative and Other  Assessment Events: blood not aspirated, injection not painful, no injection resistance, no paresthesia and negative IV test  Additional Notes Patient identified. Risks and benefits discussed including failed block, incomplete  Pain control, post dural puncture headache, nerve damage, paralysis, blood pressure Changes, nausea, vomiting, reactions to medications-both toxic and allergic and post Partum back pain. All questions were answered. Patient expressed understanding and wished to proceed. Sterile technique was used throughout procedure. Epidural site was Dressed with sterile barrier dressing. No paresthesias, signs of intravascular injection Or signs of intrathecal spread were encountered.  Patient was more comfortable after the epidural was dosed. Please see RN's note for documentation of vital signs and FHR which are stable. Reason for block:procedure for pain

## 2020-04-27 NOTE — H&P (Signed)
OB ADMISSION/ HISTORY & PHYSICAL:  Admission Date: 04/27/2020  7:56 AM  Admit Diagnosis: Encounter for induction of labor [A00.45]    [name] is a 34 y.o. female presenting for scheduled IOL at term. S/P OF ripening on Friday. Desires unmedicated birth, hydrotherapy (class completed). Feeling minimal ctx after OF, reports good FM, no VB or LOF.  Denies HA/NV/RUQ pain/visual changes.    Prenatal History: G3P1011  EDC : 05/01/2020, by Other Basis  Prenatal care at [facility] since 12 wks   Prenatal course complicated by: BMI 34, BRCA2 positive, gallbladder colic w/neg sono findings controlled w/ diet mods, Rh neg / Rhogam given 02/09/2020, hx VCI & PPH with last pregnancy IUD conception, this pregnancy no placental abnormalities, S>D EFW 97% at 37 wks. Persistent perineal yeast.  Prenatal Labs: ABO, Rh:   O neg Antibody:  neg Rubella: Immune (02/15 0000)  RPR: Nonreactive (05/10 0000)  HBsAg: Negative (02/15 0000)  HIV: Non-reactive (02/15 0000)  GBS: Negative/-- (08/09 0000)  1 hr Glucola : 109 Genetic Screening: LR Panorama XX, AFP1 wnl Ultrasound: normal anatomy, anterior palcenta TdaP UTD, no flu vax, no C19 vax    Maternal Diabetes: No Genetic Screening: Normal Maternal Ultrasounds/Referrals: Normal Fetal Ultrasounds or other Referrals:  None Maternal Substance Abuse:  No Significant Maternal Medications:  None Significant Maternal Lab Results:  Group B Strep negative Other Comments:  None  Medical / Surgical History :  Past medical history:  Past Medical History:  Diagnosis Date   BRCA2 positive    History of postpartum hemorrhage    SVT (supraventricular tachycardia) (HCC)      Past surgical history:  Past Surgical History:  Procedure Laterality Date   BREAST SURGERY     CYSTECTOMY       Family History:  Family History  Problem Relation Age of Onset   Cancer Mother    Cancer Maternal Grandfather    Cancer Paternal  Grandmother      Social History:  reports that she has never smoked. She has never used smokeless tobacco. She reports that she does not drink alcohol and does not use drugs.   Allergies: Carrot oil, Contrast media [iodinated diagnostic agents], Gadobenate, and Lamictal [lamotrigine]   Current Medications at time of admission:  Medications Prior to Admission  Medication Sig Dispense Refill Last Dose   acetaminophen (TYLENOL) 325 MG tablet Take 2 tablets (650 mg total) by mouth every 4 (four) hours as needed (for pain scale < 4).      Alfalfa 500 MG TABS Take 4 tablets (2,000 mg total) by mouth daily. Start with 500 mg and increase to 2000 mg over 1-2 weeks.  0    benzocaine-Menthol (DERMOPLAST) 20-0.5 % AERO Apply 1 application topically as needed for irritation (perineal discomfort).      clindamycin (CLINDAGEL) 1 % gel Apply 1 application topically 2 (two) times daily as needed (breakout).   0    coconut oil OIL Apply 1 application topically as needed.  0    ibuprofen (ADVIL,MOTRIN) 600 MG tablet Take 1 tablet (600 mg total) by mouth every 6 (six) hours. 30 tablet 0    iron polysaccharides (NIFEREX) 150 MG capsule Take 1 capsule (150 mg total) by mouth 2 (two) times daily.      magnesium 30 MG tablet Take 30 mg by mouth 2 (two) times daily.      magnesium oxide (MAG-OX) 400 (241.3 Mg) MG tablet Take 1 tablet (400 mg total) by mouth  daily.      metoprolol tartrate (LOPRESSOR) 50 MG tablet Take 1 tablet by mouth daily as needed (palpitations).   0    Prenatal Vit-Fe Fumarate-FA (PRENATAL VITAMIN PO) Take 1 tablet by mouth daily.         Review of Systems: ROS As noted above Physical Exam: Vital signs and nursing notes reviewed.  Patient Vitals for the past 24 hrs:  BP Temp Temp src Pulse Resp Height Weight  04/27/20 0930 108/83 -- -- (!) 123 -- -- --  04/27/20 0829 -- -- -- -- -- '5\' 9"'  (1.753 m) 120.1 kg  04/27/20 0819 118/64 98.4 F (36.9 C) Oral (!) 123 17 -- --      General: AAO x 3, NAD, some anxiety Heart: RRR Lungs:CTAB Abdomen: Gravid, NT, Leopold's fetal spine to maternal R, vertex Extremities: no edema Genitalia / VE: Dilation: 3.5 Effacement (%): 60 Station: -1 Presentation: Vertex Exam by:: d [name] cnm  Membranes sweep done, + show. FHR: 135 BPM, mod variability, + accels, no decels TOCO: Ctx irreg  Labs:   Pending T&S, CBC, RPR   Assessment:  34 y.o. G3P1011 at [redacted]w[redacted]d 1. Induction stage of labor 2. FHR category 1 3. GBS neg 4. Desires hydrotherapy, will plan land birth with slow 2nd stage progress     Patient counseled for PIV prophylactic placement and declines, agrees to placemen if needed.  5. Breastfeeding planned 6. Placenta disposal per patient request 7. Perineal yeast  Plan:  1. Admit to BS 2. Routine L&D orders 3. Analgesia/anesthesia PRN  4. Cytotec ripening, AROM in active labor, hydrotherapy in active labor, AMTSL 5. Anticipate NSVB, pelvis proven 8#6 6. Diflucan 150 mg PO now and nystatin triamcinolone topical BID to perineum   Dr FPamala Hurrynotified of admission / plan of care   [place] MSN 04/27/2020, 9:38 AM

## 2020-04-28 LAB — CBC
HCT: 31.1 % — ABNORMAL LOW (ref 36.0–46.0)
Hemoglobin: 9.9 g/dL — ABNORMAL LOW (ref 12.0–15.0)
MCH: 26.9 pg (ref 26.0–34.0)
MCHC: 31.8 g/dL (ref 30.0–36.0)
MCV: 84.5 fL (ref 80.0–100.0)
Platelets: 219 10*3/uL (ref 150–400)
RBC: 3.68 MIL/uL — ABNORMAL LOW (ref 3.87–5.11)
RDW: 14.6 % (ref 11.5–15.5)
WBC: 18.1 10*3/uL — ABNORMAL HIGH (ref 4.0–10.5)
nRBC: 0.1 % (ref 0.0–0.2)

## 2020-04-28 LAB — RPR: RPR Ser Ql: NONREACTIVE

## 2020-04-28 MED ORDER — MAGNESIUM OXIDE 400 (241.3 MG) MG PO TABS
400.0000 mg | ORAL_TABLET | Freq: Every day | ORAL | Status: DC
  Administered 2020-04-28 – 2020-04-29 (×2): 400 mg via ORAL
  Filled 2020-04-28 (×2): qty 1

## 2020-04-28 MED ORDER — POLYSACCHARIDE IRON COMPLEX 150 MG PO CAPS
150.0000 mg | ORAL_CAPSULE | Freq: Every day | ORAL | Status: DC
  Administered 2020-04-28 – 2020-04-29 (×2): 150 mg via ORAL
  Filled 2020-04-28 (×2): qty 1

## 2020-04-28 MED ORDER — SODIUM CHLORIDE 0.9 % IV SOLN
510.0000 mg | Freq: Once | INTRAVENOUS | Status: AC
  Administered 2020-04-28: 510 mg via INTRAVENOUS
  Filled 2020-04-28: qty 17

## 2020-04-28 NOTE — Progress Notes (Signed)
PPD # 1 S/P NSVD  Live born female  Birth Weight: 9 lb 1.7 oz (4131 g) APGAR: 9, 9  Newborn Delivery   Birth date/time: 04/27/2020 20:02:00 Delivery type: Vaginal, Spontaneous     Baby name: [name] Delivering provider: Neta Mends  Episiotomy:None   Lacerations:None   Feeding: breast  Pain control at delivery: Epidural   S:  Reports feeling tired but well.             Tolerating po/ No nausea or vomiting             Bleeding is light             Pain controlled with ibuprofen (OTC)             Up ad lib / ambulatory / voiding without difficulties   O:  A & O x 3, in no apparent distress              VS:  Vitals:   04/27/20 2159 04/27/20 2321 04/28/20 0355 04/28/20 0824  BP: 121/60 124/66 (!) 112/53 111/67  Pulse: (!) 118 (!) 105 66 66  Resp: 16 18 17 18   Temp: 98.1 F (36.7 C) 97.6 F (36.4 C) 97.8 F (36.6 C) 98.1 F (36.7 C)  TempSrc: Oral   Oral  SpO2: 95% 98% 99% 99%  Weight:      Height:        LABS:  Recent Labs    04/27/20 1006 04/28/20 0527  WBC 12.8* 18.1*  HGB 10.1* 9.9*  HCT 32.4* 31.1*  PLT 232 219    Blood type: --/--/O NEG (08/24 1006)  Rubella: Immune (02/15 0000)   I&O: I/O last 3 completed shifts: In: -  Out: 100 [Blood:100]          No intake/output data recorded.  Vaccines: TDaP UTD         Flu    declined   Gen: AAO x 3, NAD  Abdomen: soft, non-tender, non-distended             Fundus: firm, non-tender, U-1  Perineum: intact, no edema  Lochia: small  Extremities: trace pedal edema, no calf pain or tenderness    A/P: PPD # 1 34 y.o., 01-10-1999   Principal Problem:   Postpartum care following vaginal delivery 8/24 Active Problems:   Encounter for induction of labor   Perineal laceration with delivery, first degree IDA anemia not improved with oral Fe  - discussed benefit/risk of IV iron and desires  - IV placement for Feraheme x 1 dose then may DC  - continue oral Fe and Mag Ox also  Doing well - stable  status  Routine post partum orders  Anticipate discharge tomorrow    9/24, MSN, CNM 04/28/2020, 11:25 AM

## 2020-04-28 NOTE — Anesthesia Postprocedure Evaluation (Signed)
Anesthesia Post Note  Patient: Euleta Belson  Procedure(s) Performed: AN AD HOC LABOR EPIDURAL     Patient location during evaluation: Mother Baby Anesthesia Type: Epidural Level of consciousness: awake, awake and alert and oriented Pain management: pain level controlled Vital Signs Assessment: post-procedure vital signs reviewed and stable Respiratory status: spontaneous breathing, nonlabored ventilation and respiratory function stable Cardiovascular status: stable Postop Assessment: no headache, no backache, patient able to bend at knees, no apparent nausea or vomiting, adequate PO intake and able to ambulate Anesthetic complications: no   No complications documented.  Last Vitals:  Vitals:   04/27/20 2321 04/28/20 0355  BP: 124/66 (!) 112/53  Pulse: (!) 105 66  Resp: 18 17  Temp: 36.4 C 36.6 C  SpO2: 98% 99%    Last Pain:  Vitals:   04/28/20 0619  TempSrc:   PainSc: Asleep   Pain Goal:                   Ferron Ishmael

## 2020-04-28 NOTE — Anesthesia Postprocedure Evaluation (Signed)
Anesthesia Post Note  Patient: [name]  Procedure(s) Performed: AN AD HOC LABOR EPIDURAL     Anesthesia Type: Epidural   No complications documented.  Last Vitals:  Vitals:   04/27/20 2321 04/28/20 0355  BP: 124/66 (!) 112/53  Pulse: (!) 105 66  Resp: 18 17  Temp: 36.4 C 36.6 C  SpO2: 98% 99%    Last Pain:  Vitals:   04/28/20 0619  TempSrc:   PainSc: Asleep   Pain Goal:                   [name]

## 2020-04-28 NOTE — Lactation Note (Signed)
This note was copied from a baby's chart. Lactation Consultation Note  Patient Name: [name] [id number]'[id number] Date: 04/28/2020 Reason for consult: Initial assessment;Other (Comment) (mom sound asleep) LC reported to [facility] on 3-11p . Mom will need to be seen.   Maternal Data    Feeding Feeding Type: Breast Fed  LATCH Score                   Interventions    Lactation Tools Discussed/Used     Consult Status Consult Status: Follow-up Date: 04/28/20 Follow-up type: In-patient    [name] [name] 04/28/2020, 2:56 PM

## 2020-04-29 MED ORDER — ACETAMINOPHEN 500 MG PO TABS: 1000.0000 mg | ORAL_TABLET | Freq: Four times a day (QID) | ORAL | 0 refills | Status: DC

## 2020-04-29 MED ORDER — POLYSACCHARIDE IRON COMPLEX 150 MG PO CAPS: 150.0000 mg | ORAL_CAPSULE | Freq: Every day | ORAL | Status: DC

## 2020-04-29 MED ORDER — IBUPROFEN 600 MG PO TABS: 600.0000 mg | ORAL_TABLET | Freq: Four times a day (QID) | ORAL | 0 refills | Status: DC

## 2020-04-29 MED ORDER — NYSTATIN-TRIAMCINOLONE 100000-0.1 UNIT/GM-% EX OINT: TOPICAL_OINTMENT | Freq: Two times a day (BID) | CUTANEOUS | 0 refills | Status: DC

## 2020-04-29 MED ORDER — COCONUT OIL OIL: 1.0000 "application " | TOPICAL_OIL | 0 refills | Status: DC | PRN

## 2020-04-29 MED ORDER — MAGNESIUM OXIDE 400 (241.3 MG) MG PO TABS: 400.0000 mg | ORAL_TABLET | Freq: Every day | ORAL | 0 refills | Status: DC

## 2020-04-29 MED ORDER — BENZOCAINE-MENTHOL 20-0.5 % EX AERO: 1.0000 "application " | INHALATION_SPRAY | CUTANEOUS | Status: DC | PRN

## 2020-04-29 NOTE — Discharge Instructions (Signed)
Sitz baths 2 times /day with warm water x 1 week May add herbals: 1 ounce dried comfrey leaf* 1 ounce calendula flowers 1 ounce lavender flowers 1/2 ounce dried uva ursi leaves 1/2 ounce witch hazel blossoms (if you can find them) 1/2 ounce dried sage leaf 1/2 cup sea salt Directions: Bring 2 quarts of water to a boil. Turn off heat, and place 1 ounce (approximately 1 large handful) of the above mixed herbs (not the salt) into the pot. Steep, covered, for 30 minutes.  Strain the liquid well with a fine mesh strainer, and discard the herb material. Add 2 quarts of liquid to the tub, along with the 1/2 cup of salt. This medicinal liquid can also be made into compresses and peri-rinses. 

## 2020-04-29 NOTE — Discharge Summary (Addendum)
Postpartum Discharge Summary  Date of Service updated 04/09/2020     Patient Name: [name] DOB: 04/09/1986 MRN: [id number]  Date of admission: 04/27/2020 Delivery date:04/27/2020  Delivering provider: Juliene Pina  Date of discharge: 04/29/2020  Admitting diagnosis: Encounter for induction of labor [Z34.90] Intrauterine pregnancy: [redacted]w[redacted]d    Secondary diagnosis:  Principal Problem:   Postpartum care following vaginal delivery 8/24 Active Problems:   Encounter for induction of labor   Perineal laceration with delivery, first degree  Additional problems: Acute on chronic anemia     Discharge diagnosis: Term Pregnancy Delivered and Anemia                                              Post partum procedures:s/p IV Feraheme x 1 dose Augmentation: AROM and Cytotec Complications: None  Hospital course: Induction of Labor With Vaginal Delivery   34y.o. yo G3P1011 at 358w3das admitted to the hospital 04/27/2020 for induction of labor.  Indication for induction: Favorable cervix at term, LGA Patient had an uncomplicated labor course as follows: Membrane Rupture Time/Date: 1:59 PM ,04/27/2020   Delivery Method:Vaginal, Spontaneous  Episiotomy: None  Lacerations:  None  Details of delivery can be found in separate delivery note.  Patient had a routine postpartum course. Patient is discharged home 04/29/20.  Newborn Data: Birth date:04/27/2020  Birth time:8:02 PM  Gender:Female  Living status:Living  Apgars:9 ,9  Weight:4131 g   "HeSarita Haver Magnesium Sulfate received: No BMZ received: No Rhophylac:No - not indicated PP due to baby RH neg MMR:N/A T-DaP:Given prenatally Flu: No Transfusion:No  Physical exam  Vitals:   04/28/20 0824 04/28/20 1220 04/28/20 1956 04/29/20 0531  BP: 111/67 (!) 100/54 118/74 113/64  Pulse: 66 68 80 (!) 55  Resp: '18 18 16 17  ' Temp: 98.1 F (36.7 C) 98.2 F (36.8 C) 98.2 F (36.8 C) 97.6 F (36.4 C)  TempSrc: Oral Oral Oral Oral   SpO2: 99%  98% 99%  Weight:      Height:       General: alert, cooperative and no distress  Heart: RRR Lungs: clear Lochia: appropriate Uterine Fundus: firm, below umbilicus  Perineum: well approximated 1st degree perineal, mild edema  DVT Evaluation: No evidence of DVT seen on physical exam. No edema  Labs: Lab Results  Component Value Date   WBC 18.1 (H) 04/28/2020   HGB 9.9 (L) 04/28/2020   HCT 31.1 (L) 04/28/2020   MCV 84.5 04/28/2020   PLT 219 04/28/2020   CMP 02/19/2007  Glucose 92  BUN 9  Creatinine 0.65  Sodium 137  Potassium 3.4(L)  Chloride 104  CO2 25  Calcium 8.9   Edinburgh Score: Edinburgh Postnatal Depression Scale Screening Tool 04/27/2020  I have been able to laugh and see the funny side of things. (No Data)  I have looked forward with enjoyment to things. -  I have blamed myself unnecessarily when things went wrong. -  I have been anxious or worried for no good reason. -  I have felt scared or panicky for no good reason. -  Things have been getting on top of me. -  I have been so unhappy that I have had difficulty sleeping. -  I have felt sad or miserable. -  I have been so unhappy that I have been crying. -  The  thought of harming myself has occurred to me. Flavia Shipper Postnatal Depression Scale Total -      After visit meds:  Allergies as of 04/29/2020      Reactions   Carrot Oil Anaphylaxis   Contrast Media [iodinated Diagnostic Agents] Anaphylaxis   Gadobenate Shortness Of Breath   Experienced difficulty breathing   Lamictal [lamotrigine] Anaphylaxis      Medication List    STOP taking these medications   Alfalfa 500 MG Tabs     TAKE these medications   acetaminophen 500 MG tablet Commonly known as: TYLENOL Take 2 tablets (1,000 mg total) by mouth every 6 (six) hours.   benzocaine-Menthol 20-0.5 % Aero Commonly known as: DERMOPLAST Apply 1 application topically as needed for irritation (perineal discomfort).   coconut oil  Oil Apply 1 application topically as needed.   ibuprofen 600 MG tablet Commonly known as: ADVIL Take 1 tablet (600 mg total) by mouth every 6 (six) hours.   iron polysaccharides 150 MG capsule Commonly known as: NIFEREX Take 1 capsule (150 mg total) by mouth daily.   magnesium oxide 400 (241.3 Mg) MG tablet Commonly known as: MAG-OX Take 1 tablet (400 mg total) by mouth daily.   nystatin-triamcinolone ointment Commonly known as: MYCOLOG Apply topically 2 (two) times daily.        Discharge home in stable condition Infant Feeding: Breast Infant Disposition:home with mother if weight stable, otherwise baby will stay inpatient and mom will room in  Discharge instruction: per After Visit Summary and Postpartum booklet. Activity: Advance as tolerated. Pelvic rest for 6 weeks.  Diet: low salt diet Anticipated Birth Control: Unsure Postpartum Appointment:2 weeks Additional Postpartum F/U: Postpartum Depression checkup Future Appointments:No future appointments. Follow up Visit:  Follow-up Information    Juliene Pina, CNM. Schedule an appointment as soon as possible for a visit in 2 week(s).   Specialty: Obstetrics and Gynecology Contact information: [place] [id number] [phone]                   04/29/2020 Darliss Cheney, CNM

## 2020-04-29 NOTE — Lactation Note (Addendum)
This note was copied from a baby's chart. Lactation Consultation Note  Patient Name: [name] Date: 04/29/2020 Reason for consult: Follow-up assessment;Term;Infant weight loss;Other (Comment) (12 % weight loss)  Baby is 25 hours old  As LC entered the room dad holding baby ( awake ) and then went off to sleep.  Mom pumping with DEBP the MBURN set up with #24 F and per mom comfortable. While talking to mom and discussing the LC recommended plan for 12 % weight lost ( appeared to be approx 30 ml combined in both the bottles.  LC recommended to feed with feeding cues and by 3 hours if not showing cues latching at the breast with 5 F SNS. ( accomplish the whole feeding and supplementing on 1st breast and both pump and the next feeding switch the breast and do the same to increase the weight steadily.  LC also recommended to increase the flow, moist heat , massage, hand express, pre-pump prior to latch until milk comes in more.   LC reviewed and updated the doc flow sheets for feeding since 6: 37 am per mom.   Per mom has a DEBP Spectra at home.  Mom aware to call with next feeding for feeding assessment.      Maternal Data Has patient been taught Hand Expression?: Yes Does the patient have breastfeeding experience prior to this delivery?: Yes  Feeding Feeding Type:  (baby last fed at 9:10 am for 13 mins)  LATCH Score                   Interventions Interventions: Breast feeding basics reviewed;DEBP;Hand pump  Lactation Tools Discussed/Used Tools: Pump;Flanges Flange Size: 24 Breast pump type: Double-Electric Breast Pump;Manual WIC Program: No Pump Review: Milk Storage;Setup, frequency, and cleaning (by RN)   Consult Status Consult Status: Follow-up Date: 04/29/20 Follow-up type: In-patient    [name] 04/29/2020, 10:54 AM

## 2020-05-25 DIAGNOSIS — S99829A Other specified injuries of unspecified foot, initial encounter: Secondary | ICD-10-CM | POA: Diagnosis not present

## 2020-09-06 DIAGNOSIS — Z1159 Encounter for screening for other viral diseases: Secondary | ICD-10-CM | POA: Diagnosis not present

## 2020-11-21 IMAGING — US US ABDOMEN LIMITED
1 series · 14 of 25 positions shown · non-contrast
Comparison: None.

CLINICAL DATA: Right upper quadrant pain for 9 days

EXAM:
ULTRASOUND ABDOMEN LIMITED RIGHT UPPER QUADRANT

[Series 1: us abdomen limited · 0.25mm/px · 14 of 41 slices shown]
[im 1/41]
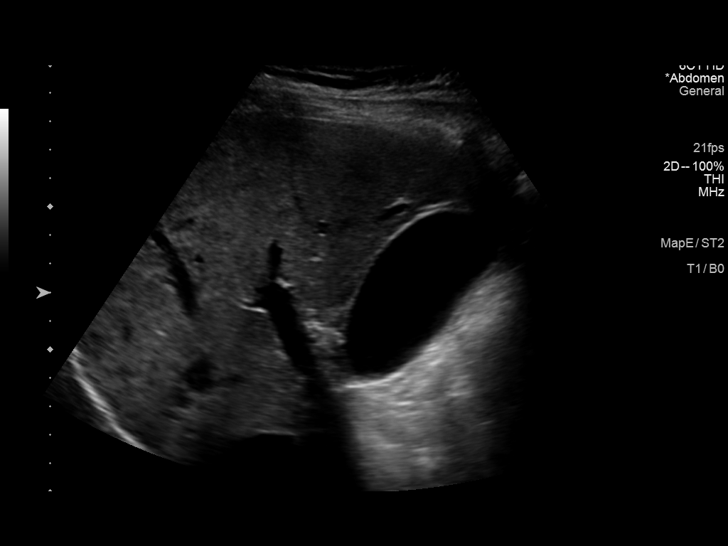
[im 4/41]
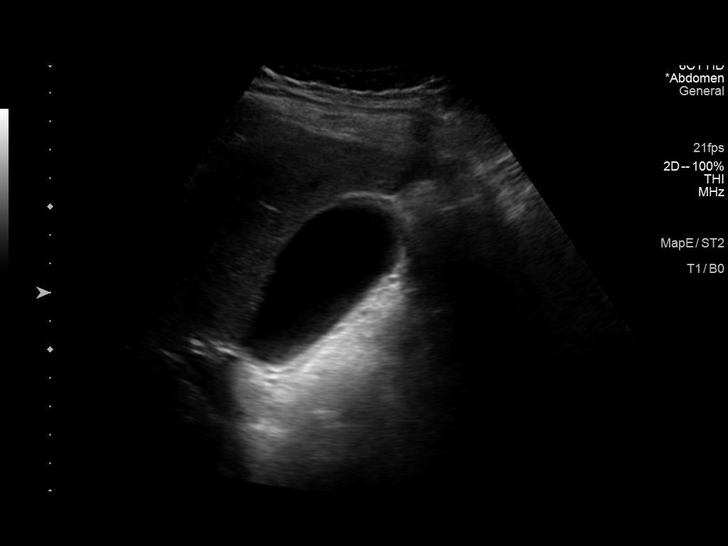
[im 7/41]
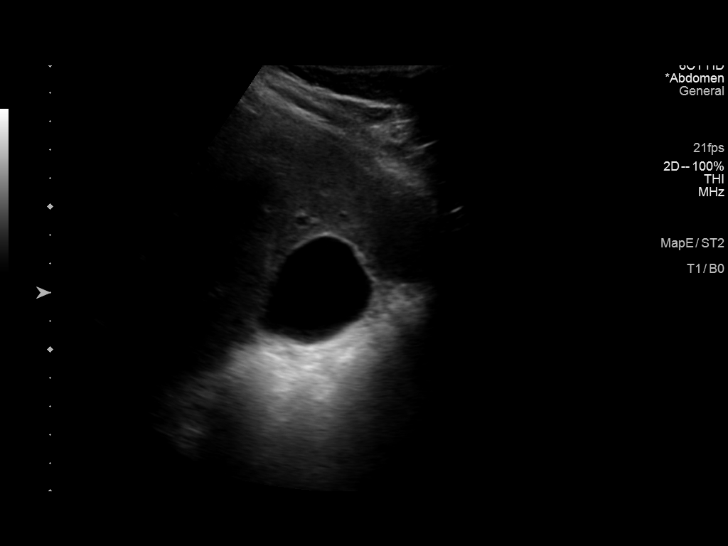
[im 11/41]
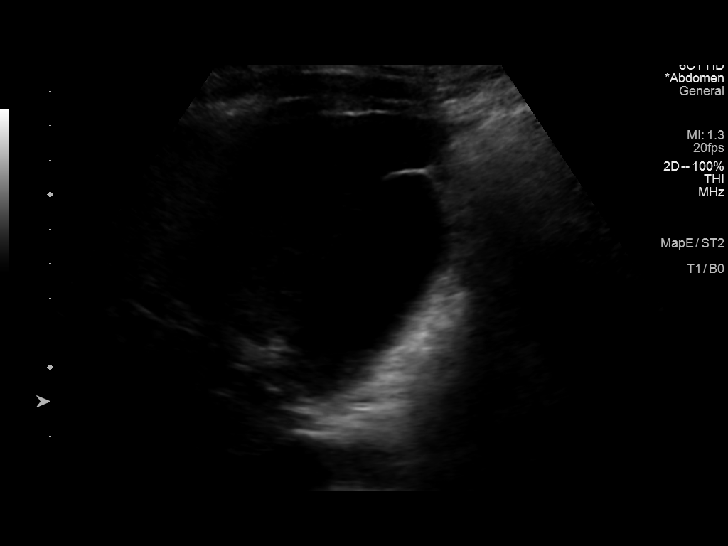
[im 14/41]
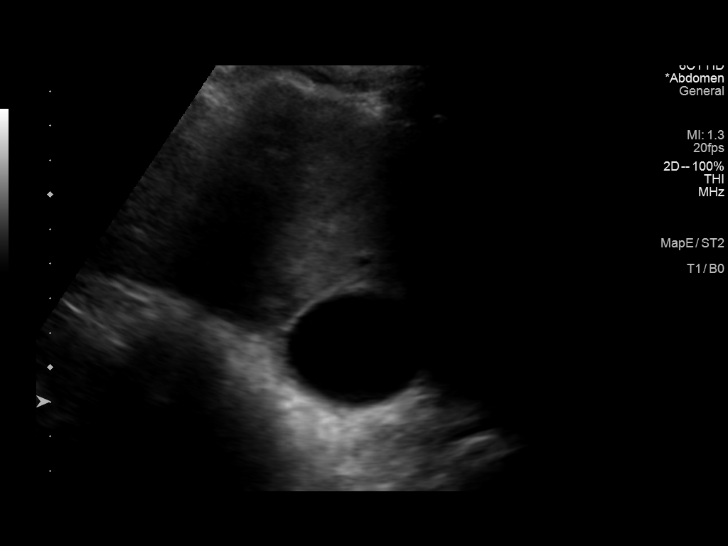
[im 16/41]
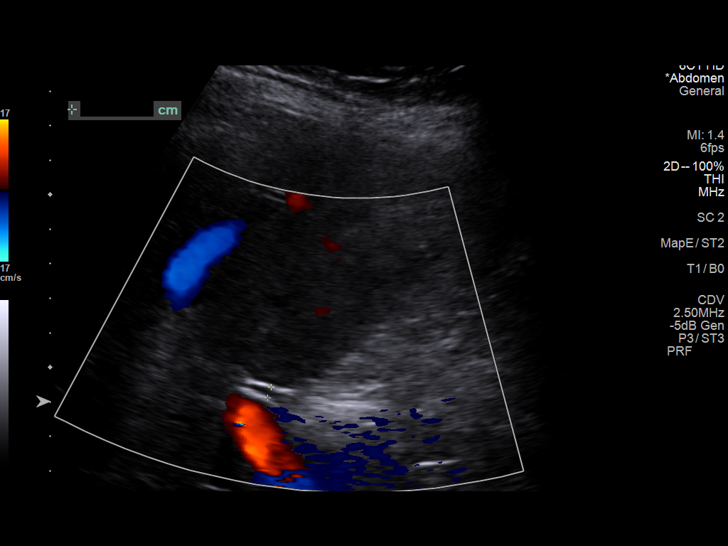
[im 19/41]
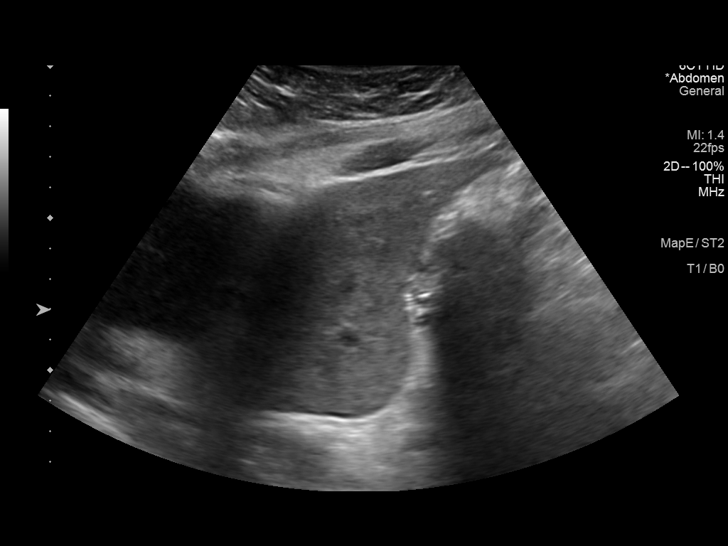
[im 22/41]
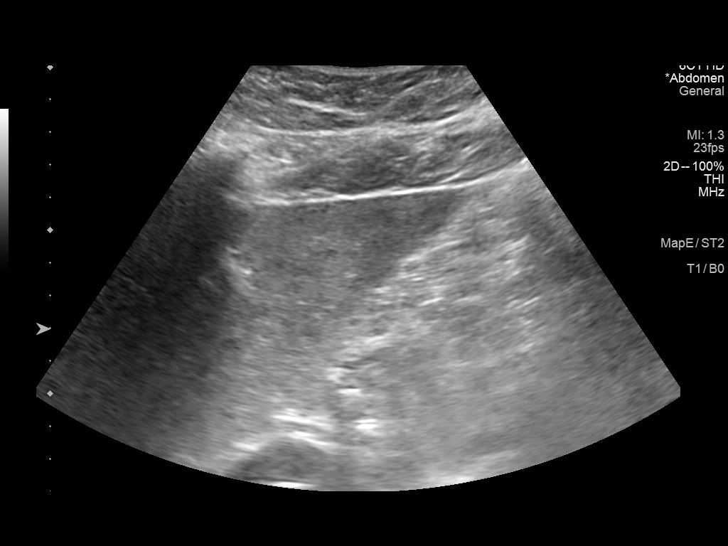
[im 26/41]
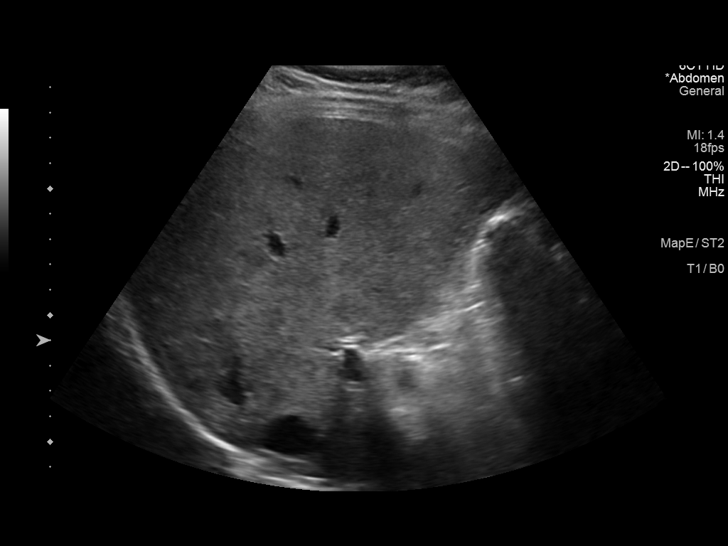
[im 27/41]
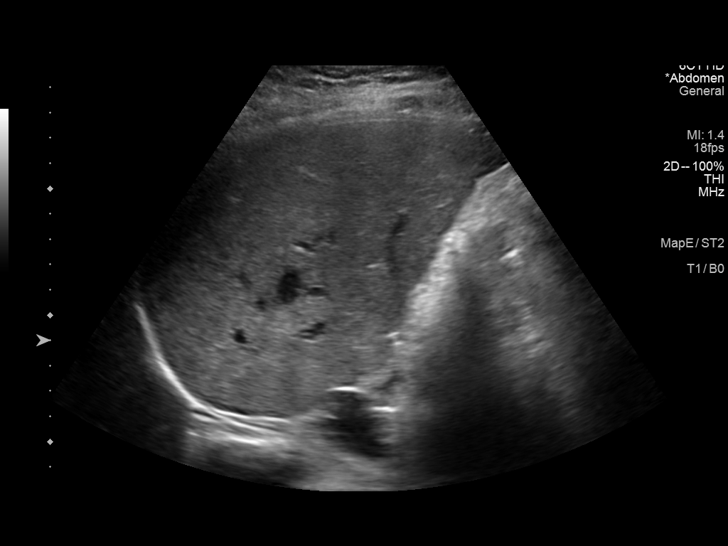
[im 31/41]
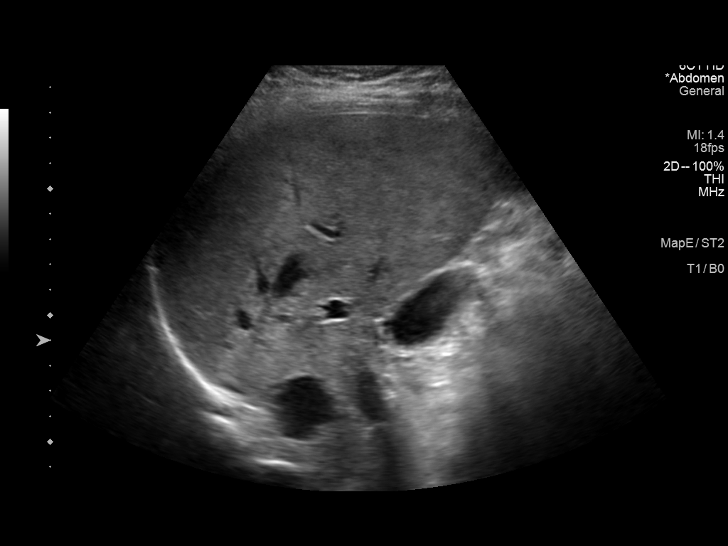
[im 34/41]
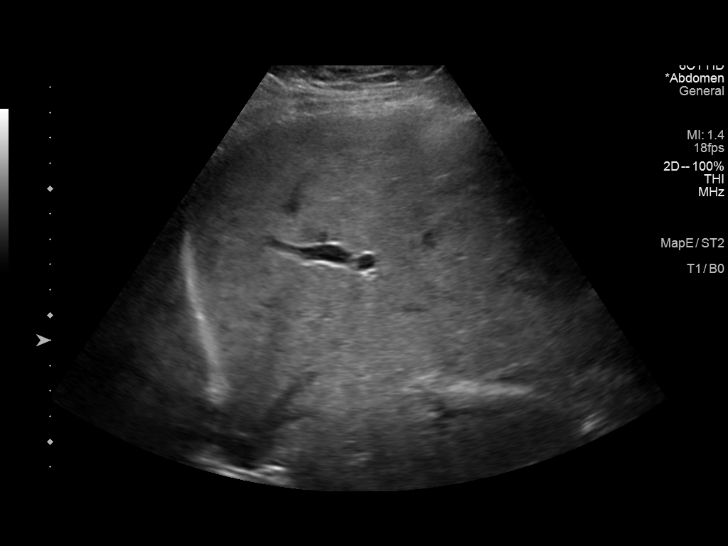
[im 37/41]
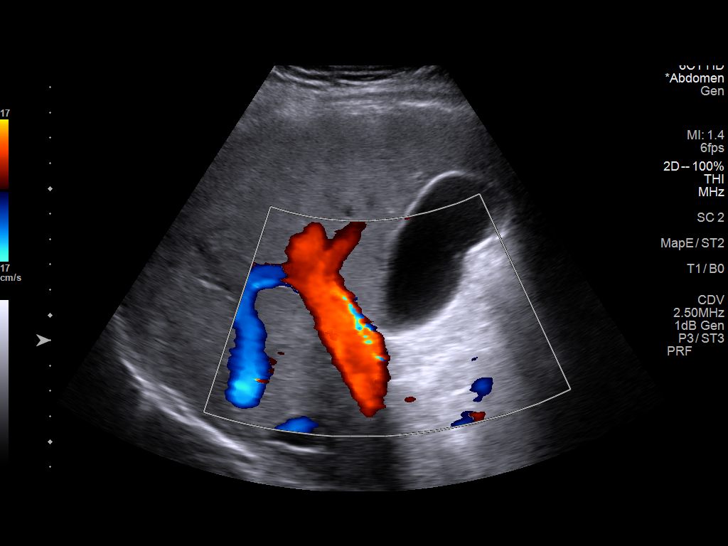
[im 41/41]
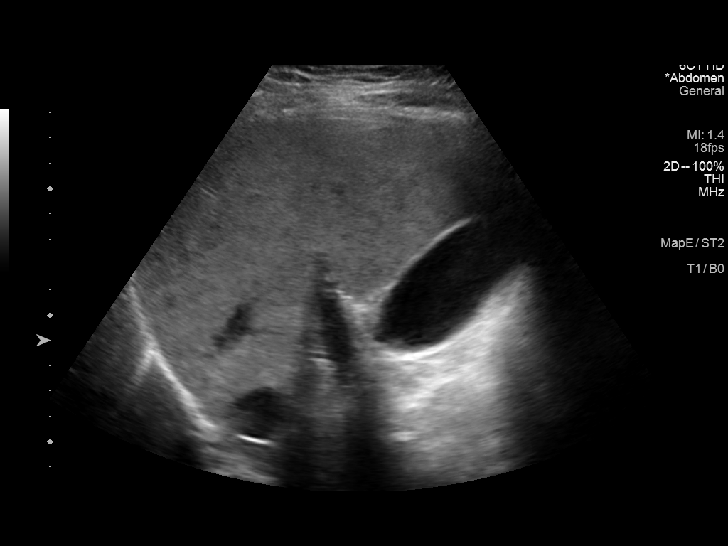

[14 of 25 positions shown; findings below may reference images not displayed]

FINDINGS: Gallbladder:

No gallstones or wall thickening visualized. No sonographic Murphy
sign noted by sonographer.

Common bile duct:

Diameter: 3 mm

Liver:

No focal lesion identified. Within normal limits in parenchymal
echogenicity. Portal vein is patent on color Doppler imaging with
normal direction of blood flow towards the liver.

Other: None.
IMPRESSION: No ultrasound findings of the right upper quadrant to explain pain.
Consider CT or MRI to further evaluate otherwise unexplained
abdominal pain.

## 2020-11-25 DIAGNOSIS — Z1502 Genetic susceptibility to malignant neoplasm of ovary: Secondary | ICD-10-CM | POA: Diagnosis not present

## 2020-11-25 DIAGNOSIS — Z803 Family history of malignant neoplasm of breast: Secondary | ICD-10-CM | POA: Diagnosis not present

## 2020-11-25 DIAGNOSIS — Z1501 Genetic susceptibility to malignant neoplasm of breast: Secondary | ICD-10-CM | POA: Diagnosis not present

## 2020-11-25 DIAGNOSIS — Z1509 Genetic susceptibility to other malignant neoplasm: Secondary | ICD-10-CM | POA: Diagnosis not present

## 2020-12-29 DIAGNOSIS — Z1501 Genetic susceptibility to malignant neoplasm of breast: Secondary | ICD-10-CM | POA: Diagnosis not present

## 2020-12-29 DIAGNOSIS — Z1509 Genetic susceptibility to other malignant neoplasm: Secondary | ICD-10-CM | POA: Diagnosis not present

## 2020-12-29 DIAGNOSIS — Z7189 Other specified counseling: Secondary | ICD-10-CM | POA: Diagnosis not present

## 2020-12-29 DIAGNOSIS — N838 Other noninflammatory disorders of ovary, fallopian tube and broad ligament: Secondary | ICD-10-CM | POA: Diagnosis not present

## 2020-12-29 DIAGNOSIS — Z803 Family history of malignant neoplasm of breast: Secondary | ICD-10-CM | POA: Diagnosis not present

## 2020-12-29 DIAGNOSIS — N858 Other specified noninflammatory disorders of uterus: Secondary | ICD-10-CM | POA: Diagnosis not present

## 2020-12-29 DIAGNOSIS — Z1502 Genetic susceptibility to malignant neoplasm of ovary: Secondary | ICD-10-CM | POA: Diagnosis not present

## 2021-02-24 DIAGNOSIS — J019 Acute sinusitis, unspecified: Secondary | ICD-10-CM | POA: Diagnosis not present

## 2021-05-27 DIAGNOSIS — E669 Obesity, unspecified: Secondary | ICD-10-CM | POA: Diagnosis not present

## 2021-05-27 DIAGNOSIS — R635 Abnormal weight gain: Secondary | ICD-10-CM | POA: Diagnosis not present

## 2021-05-27 DIAGNOSIS — G8929 Other chronic pain: Secondary | ICD-10-CM | POA: Diagnosis not present

## 2021-05-27 DIAGNOSIS — Z1501 Genetic susceptibility to malignant neoplasm of breast: Secondary | ICD-10-CM | POA: Diagnosis not present

## 2021-05-27 DIAGNOSIS — Z6835 Body mass index (BMI) 35.0-35.9, adult: Secondary | ICD-10-CM | POA: Diagnosis not present

## 2021-05-27 DIAGNOSIS — M545 Low back pain, unspecified: Secondary | ICD-10-CM | POA: Diagnosis not present

## 2021-05-28 DIAGNOSIS — I471 Supraventricular tachycardia, unspecified: Secondary | ICD-10-CM | POA: Insufficient documentation

## 2021-05-28 DIAGNOSIS — Z1589 Genetic susceptibility to other disease: Secondary | ICD-10-CM | POA: Insufficient documentation

## 2021-05-28 DIAGNOSIS — M545 Low back pain, unspecified: Secondary | ICD-10-CM | POA: Insufficient documentation

## 2021-05-28 DIAGNOSIS — Z1501 Genetic susceptibility to malignant neoplasm of breast: Secondary | ICD-10-CM | POA: Insufficient documentation

## 2021-05-28 DIAGNOSIS — G8929 Other chronic pain: Secondary | ICD-10-CM | POA: Insufficient documentation

## 2021-08-25 DIAGNOSIS — M545 Low back pain, unspecified: Secondary | ICD-10-CM | POA: Diagnosis not present

## 2021-08-25 DIAGNOSIS — G8929 Other chronic pain: Secondary | ICD-10-CM | POA: Diagnosis not present

## 2021-09-04 NOTE — L&D Delivery Note (Signed)
      Delivery Note:   [id number] at [redacted]w[redacted]d  Admitting diagnosis: Encounter for induction of labor [Z34.90] Risks: RH negative status Anxiety History of PPH LGA  First Stage:  Induction of labor:OP Foley placed 07/24/22 expelled during admission to labor unit.  Onset of labor: 1730 Augmentation: Cytotec, AROM, and pitocin ROM: small clear fluid Active labor onset: 1930 Hydrotherapy: tub time 53 minutes, temp 97.6  Analgesia /Anesthesia/Pain control intrapartum: Epidural   Second Stage:  Complete dilation at 07/25/2022  2212 Onset of pushing at 2212 FHR second stage 125   Pushing in sidelying position R tilt with SNM, CNM and L&D staff support, Josh present for birth and supportive. Nuchal Cord: Yes ,x2 somersault Delivery of a Live born female  Birth Weight:   APGAR: 9, 9  Newborn Delivery   Birth date/time: 07/25/2022 22:32:08 Delivery type: Vaginal, Spontaneous      in cephalic presentation, position OA  to ROA. Shoulders and body delivered easily Newborn with lusty cry, good tone, dried and stimulated, placed on maternal abdomen.  Cord double clamped after cessation of pulsation, cut by Josh.  Collection of cord blood for typing completed. Cord blood donation-None  Arterial cord blood sample-No    Third Stage:  Placenta delivered 07/25/2022 at 2253-Spontaneous  with 3 vessels . Uterine tone firm, bleeding small. Uterotonics: pitocin Placenta to L&D.  None  laceration identified.  Episiotomy:None  Local analgesia: none  Repair: NA Est. Blood Loss (mL):178.00   Complications: None   Mom to postpartum.  Baby Beau to Couplet care / Skin to Skin.  Delivery Report:  Review the Delivery Report for details.     Signed: Lamont Snowball, SNM 07/25/2022, 11:12 PM

## 2021-12-05 DIAGNOSIS — Z8619 Personal history of other infectious and parasitic diseases: Secondary | ICD-10-CM | POA: Insufficient documentation

## 2021-12-05 DIAGNOSIS — F419 Anxiety disorder, unspecified: Secondary | ICD-10-CM | POA: Insufficient documentation

## 2022-01-10 DIAGNOSIS — B372 Candidiasis of skin and nail: Secondary | ICD-10-CM | POA: Insufficient documentation

## 2022-01-10 LAB — OB RESULTS CONSOLE GC/CHLAMYDIA
Chlamydia: NEGATIVE
Neisseria Gonorrhea: NEGATIVE

## 2022-01-10 LAB — OB RESULTS CONSOLE VARICELLA ZOSTER ANTIBODY, IGG: Varicella: IMMUNE

## 2022-01-10 LAB — HEPATITIS C ANTIBODY: HCV Ab: NEGATIVE

## 2022-01-10 LAB — OB RESULTS CONSOLE HEPATITIS B SURFACE ANTIGEN: Hepatitis B Surface Ag: NEGATIVE

## 2022-01-10 LAB — OB RESULTS CONSOLE HIV ANTIBODY (ROUTINE TESTING): HIV: NONREACTIVE

## 2022-01-10 LAB — OB RESULTS CONSOLE RUBELLA ANTIBODY, IGM: Rubella: IMMUNE

## 2022-01-10 LAB — OB RESULTS CONSOLE RPR: RPR: NONREACTIVE

## 2022-02-09 DIAGNOSIS — Z3480 Encounter for supervision of other normal pregnancy, unspecified trimester: Secondary | ICD-10-CM | POA: Insufficient documentation

## 2022-04-12 DIAGNOSIS — L732 Hidradenitis suppurativa: Secondary | ICD-10-CM | POA: Insufficient documentation

## 2022-05-26 ENCOUNTER — Encounter (HOSPITAL_COMMUNITY): Payer: Self-pay | Admitting: Obstetrics and Gynecology

## 2022-05-26 ENCOUNTER — Inpatient Hospital Stay (HOSPITAL_COMMUNITY)
Admission: AD | Admit: 2022-05-26 | Discharge: 2022-05-26 | Disposition: A | Discharge status: Home / Self Care | Payer: BC Managed Care – PPO | Attending: Obstetrics and Gynecology | Admitting: Obstetrics and Gynecology

## 2022-05-26 ENCOUNTER — Other Ambulatory Visit: Payer: Self-pay

## 2022-05-26 DIAGNOSIS — Z3A30 30 weeks gestation of pregnancy: Secondary | ICD-10-CM | POA: Insufficient documentation

## 2022-05-26 DIAGNOSIS — O09293 Supervision of pregnancy with other poor reproductive or obstetric history, third trimester: Secondary | ICD-10-CM | POA: Insufficient documentation

## 2022-05-26 DIAGNOSIS — O26893 Other specified pregnancy related conditions, third trimester: Secondary | ICD-10-CM

## 2022-05-26 DIAGNOSIS — W19XXXA Unspecified fall, initial encounter: Secondary | ICD-10-CM | POA: Insufficient documentation

## 2022-05-26 MED ORDER — CYCLOBENZAPRINE HCL 5 MG PO TABS
10.0000 mg | ORAL_TABLET | Freq: Once | ORAL | Status: AC
  Administered 2022-05-26: 10 mg via ORAL
  Filled 2022-05-26: qty 2

## 2022-05-26 MED ORDER — CYCLOBENZAPRINE HCL 10 MG PO TABS: 10.0000 mg | ORAL_TABLET | Freq: Three times a day (TID) | ORAL | 0 refills | Status: DC | PRN

## 2022-05-26 MED ORDER — ACETAMINOPHEN 500 MG PO TABS: 1000.0000 mg | ORAL_TABLET | Freq: Once | ORAL | Status: DC

## 2022-05-26 NOTE — MAU Provider Note (Signed)
History     CSN: [id number]  Arrival date and time: 05/26/22 1514   Event Date/Time   First Provider Initiated Contact with Patient 05/26/22 1646      Chief Complaint  Patient presents with   Fall   HPI  Ms. [name] is a 35 y.o. female [id number] in MAU with complaints of fall. The fall occurred at home approximately 1 hour ago. She reports that she slipped on her dogs urine that she did not see on the floor. She fell back onto her butt and her back. She did not hit her abdomen. It is unknown whether she hit her head. She reports no nausea or vomiting. No bleeding, no leaking of fluid. Reports good fetal movement. Does report overall soreness all over her body.   OB History     Gravida  4   Para  2   Term  2   Preterm      AB  1   Living  2      SAB      IAB  1   Ectopic      Multiple  0   Live Births  2           Past Medical History:  Diagnosis Date   BRCA2 positive    History of postpartum hemorrhage    SVT (supraventricular tachycardia) (HCC)     Past Surgical History:  Procedure Laterality Date   BREAST SURGERY     CYSTECTOMY      Family History  Problem Relation Age of Onset   Cancer Mother    Cancer Maternal Grandfather    Cancer Paternal Grandmother     Social History   Tobacco Use   Smoking status: Never   Smokeless tobacco: Never  Vaping Use   Vaping Use: Never used  Substance Use Topics   Alcohol use: No    Alcohol/week: 0.0 standard drinks of alcohol    Comment: none with pregnancy   Drug use: No    Allergies:  Allergies  Allergen Reactions   Carrot Oil Anaphylaxis   Contrast Media [Iodinated Contrast Media] Anaphylaxis   Gadobenate Shortness Of Breath    Experienced difficulty breathing   Lamictal [Lamotrigine] Anaphylaxis    Medications Prior to Admission  Medication Sig Dispense Refill Last Dose   coconut oil OIL Apply 1 application topically as needed.  0 Past Month   iron  polysaccharides (NIFEREX) 150 MG capsule Take 1 capsule (150 mg total) by mouth daily.   05/25/2022   magnesium oxide (MAG-OX) 400 (241.3 Mg) MG tablet Take 1 tablet (400 mg total) by mouth daily. 30 tablet 0 05/25/2022   nystatin-triamcinolone ointment (MYCOLOG) Apply topically 2 (two) times daily. 30 g 0 Past Month   acetaminophen (TYLENOL) 500 MG tablet Take 2 tablets (1,000 mg total) by mouth every 6 (six) hours. (Patient not taking: Reported on 05/26/2022) 60 tablet 0 Not Taking   benzocaine-Menthol (DERMOPLAST) 20-0.5 % AERO Apply 1 application topically as needed for irritation (perineal discomfort). (Patient not taking: Reported on 05/26/2022)   Not Taking   ibuprofen (ADVIL) 600 MG tablet Take 1 tablet (600 mg total) by mouth every 6 (six) hours. (Patient not taking: Reported on 05/26/2022) 30 tablet 0 Not Taking   No results found for this or any previous visit (from the past 48 hour(s)).   Review of Systems  Constitutional:  Negative for fever.  Gastrointestinal:  Negative for abdominal pain.  Genitourinary:  Negative  for vaginal bleeding and vaginal discharge.  Musculoskeletal:  Positive for back pain. Negative for gait problem.   Physical Exam   Blood pressure (!) 101/57, pulse (!) 122, temperature 97.6 F (36.4 C), temperature source Oral, resp. rate 20, height 5' 9.5" (1.765 m), weight 122.6 kg, SpO2 98 %, unknown if currently breastfeeding.  Physical Exam Constitutional:      General: She is not in acute distress.    Appearance: Normal appearance. She is not ill-appearing, toxic-appearing or diaphoretic.  HENT:     Head: Normocephalic.  Musculoskeletal:        General: Normal range of motion.  Skin:    General: Skin is warm.  Neurological:     Mental Status: She is alert and oriented to person, place, and time.  Psychiatric:        Behavior: Behavior normal.   Fetal Tracing: Baseline: 130 bpm Variability: Moderate  Accelerations: 15x15 Decelerations: None Toco:   None   MAU Course  Procedures  MDM  4 hours of fetal monitoring complete Flexeril given for back pain. She declines tylenol.   Assessment and Plan   A:  1. Fall, initial encounter   2. [redacted] weeks gestation of pregnancy      P:  Dc home Return to MAU as needed Concerning symptoms reviewed Rest this weekend Rx: Marliss Coots, NP 05/26/2022 8:14 PM

## 2022-05-26 NOTE — MAU Note (Signed)
[name] is a 36 y.o. at [redacted]w[redacted]d here in MAU reporting: she fell approximately 1 hour ago landing flat onto her back.  Reports +FM since fall.  Denies VB or LOF. LMP: N/A Onset of complaint: today Pain score: 4 Vitals:   05/26/22 1601  BP: (!) 101/57  Pulse: (!) 122  Resp: 20  Temp: 97.6 F (36.4 C)  SpO2: 98%     FHT:142 bpm Lab orders placed from triage:   None

## 2022-07-03 ENCOUNTER — Telehealth (HOSPITAL_COMMUNITY): Payer: Self-pay

## 2022-07-03 NOTE — Telephone Encounter (Signed)
Preadmission screening 

## 2022-07-04 LAB — OB RESULTS CONSOLE GBS: GBS: NEGATIVE

## 2022-07-19 ENCOUNTER — Telehealth (HOSPITAL_COMMUNITY): Payer: Self-pay | Admitting: *Deleted

## 2022-07-19 NOTE — Telephone Encounter (Signed)
Preadmission screen  

## 2022-07-21 ENCOUNTER — Encounter (HOSPITAL_COMMUNITY): Payer: Self-pay | Admitting: *Deleted

## 2022-07-24 ENCOUNTER — Other Ambulatory Visit: Payer: Self-pay

## 2022-07-25 ENCOUNTER — Inpatient Hospital Stay (HOSPITAL_COMMUNITY): Payer: BC Managed Care – PPO | Admitting: Anesthesiology

## 2022-07-25 ENCOUNTER — Other Ambulatory Visit: Payer: Self-pay

## 2022-07-25 ENCOUNTER — Inpatient Hospital Stay (HOSPITAL_COMMUNITY): Payer: BC Managed Care – PPO | Attending: Physician Assistant

## 2022-07-25 ENCOUNTER — Inpatient Hospital Stay (HOSPITAL_COMMUNITY)
Admission: AD | Admit: 2022-07-25 | Discharge: 2022-07-27 | DRG: 807 | Disposition: A | Discharge status: Home / Self Care | Payer: BC Managed Care – PPO | Attending: Obstetrics & Gynecology | Admitting: Obstetrics & Gynecology

## 2022-07-25 ENCOUNTER — Encounter (HOSPITAL_COMMUNITY): Payer: Self-pay

## 2022-07-25 DIAGNOSIS — O9902 Anemia complicating childbirth: Secondary | ICD-10-CM | POA: Diagnosis not present

## 2022-07-25 DIAGNOSIS — Z1501 Genetic susceptibility to malignant neoplasm of breast: Secondary | ICD-10-CM

## 2022-07-25 DIAGNOSIS — Z3A39 39 weeks gestation of pregnancy: Secondary | ICD-10-CM | POA: Diagnosis not present

## 2022-07-25 DIAGNOSIS — O99214 Obesity complicating childbirth: Secondary | ICD-10-CM | POA: Diagnosis present

## 2022-07-25 DIAGNOSIS — O3663X0 Maternal care for excessive fetal growth, third trimester, not applicable or unspecified: Secondary | ICD-10-CM | POA: Diagnosis present

## 2022-07-25 DIAGNOSIS — O26893 Other specified pregnancy related conditions, third trimester: Secondary | ICD-10-CM | POA: Diagnosis present

## 2022-07-25 DIAGNOSIS — Z3490 Encounter for supervision of normal pregnancy, unspecified, unspecified trimester: Secondary | ICD-10-CM | POA: Diagnosis present

## 2022-07-25 DIAGNOSIS — Z6791 Unspecified blood type, Rh negative: Secondary | ICD-10-CM

## 2022-07-25 LAB — CBC
HCT: 31.7 % — ABNORMAL LOW (ref 36.0–46.0)
Hemoglobin: 10.5 g/dL — ABNORMAL LOW (ref 12.0–15.0)
MCH: 27.9 pg (ref 26.0–34.0)
MCHC: 33.1 g/dL (ref 30.0–36.0)
MCV: 84.1 fL (ref 80.0–100.0)
Platelets: 188 10*3/uL (ref 150–400)
RBC: 3.77 MIL/uL — ABNORMAL LOW (ref 3.87–5.11)
RDW: 14.1 % (ref 11.5–15.5)
WBC: 11.3 10*3/uL — ABNORMAL HIGH (ref 4.0–10.5)
nRBC: 0 % (ref 0.0–0.2)

## 2022-07-25 LAB — TYPE AND SCREEN
ABO/RH(D): O NEG
Antibody Screen: POSITIVE

## 2022-07-25 LAB — RPR: RPR Ser Ql: NONREACTIVE

## 2022-07-25 MED ORDER — EPHEDRINE 5 MG/ML INJ: 10.0000 mg | INTRAVENOUS | Status: DC | PRN

## 2022-07-25 MED ORDER — ACETAMINOPHEN 325 MG PO TABS: 650.0000 mg | ORAL_TABLET | ORAL | Status: DC | PRN

## 2022-07-25 MED ORDER — ONDANSETRON HCL 4 MG/2ML IJ SOLN: 4.0000 mg | Freq: Four times a day (QID) | INTRAMUSCULAR | Status: DC | PRN

## 2022-07-25 MED ORDER — TERBUTALINE SULFATE 1 MG/ML IJ SOLN: 0.2500 mg | Freq: Once | INTRAMUSCULAR | Status: DC | PRN

## 2022-07-25 MED ORDER — LIDOCAINE HCL (PF) 1 % IJ SOLN: 30.0000 mL | INTRAMUSCULAR | Status: DC | PRN

## 2022-07-25 MED ORDER — DIPHENHYDRAMINE HCL 50 MG/ML IJ SOLN: 12.5000 mg | INTRAMUSCULAR | Status: DC | PRN

## 2022-07-25 MED ORDER — FENTANYL-BUPIVACAINE-NACL 0.5-0.125-0.9 MG/250ML-% EP SOLN
EPIDURAL | Status: AC
  Filled 2022-07-25: qty 250

## 2022-07-25 MED ORDER — PHENYLEPHRINE 80 MCG/ML (10ML) SYRINGE FOR IV PUSH (FOR BLOOD PRESSURE SUPPORT)
80.0000 ug | PREFILLED_SYRINGE | INTRAVENOUS | Status: DC | PRN
  Administered 2022-07-25 (×2): 80 ug via INTRAVENOUS

## 2022-07-25 MED ORDER — LIDOCAINE HCL (PF) 1 % IJ SOLN
INTRAMUSCULAR | Status: DC | PRN
  Administered 2022-07-25: 11 mL via EPIDURAL

## 2022-07-25 MED ORDER — SOD CITRATE-CITRIC ACID 500-334 MG/5ML PO SOLN: 30.0000 mL | ORAL | Status: DC | PRN

## 2022-07-25 MED ORDER — OXYTOCIN-SODIUM CHLORIDE 30-0.9 UT/500ML-% IV SOLN
INTRAVENOUS | Status: AC
  Filled 2022-07-25: qty 500

## 2022-07-25 MED ORDER — FENTANYL-BUPIVACAINE-NACL 0.5-0.125-0.9 MG/250ML-% EP SOLN
12.0000 mL/h | EPIDURAL | Status: DC | PRN
  Administered 2022-07-25: 12 mL/h via EPIDURAL

## 2022-07-25 MED ORDER — PHENYLEPHRINE 80 MCG/ML (10ML) SYRINGE FOR IV PUSH (FOR BLOOD PRESSURE SUPPORT)
80.0000 ug | PREFILLED_SYRINGE | INTRAVENOUS | Status: DC | PRN
  Filled 2022-07-25: qty 10

## 2022-07-25 MED ORDER — LACTATED RINGERS IV SOLN
500.0000 mL | Freq: Once | INTRAVENOUS | Status: AC
  Administered 2022-07-25: 500 mL via INTRAVENOUS

## 2022-07-25 MED ORDER — MISOPROSTOL 50MCG HALF TABLET
50.0000 ug | ORAL_TABLET | ORAL | Status: DC | PRN
  Administered 2022-07-25 (×2): 50 ug via BUCCAL
  Filled 2022-07-25 (×2): qty 1

## 2022-07-25 MED ORDER — OXYTOCIN 10 UNIT/ML IJ SOLN
10.0000 [IU] | Freq: Once | INTRAMUSCULAR | Status: AC
  Administered 2022-07-25: 10 [IU] via INTRAMUSCULAR

## 2022-07-25 NOTE — Lactation Note (Signed)
This note was copied from a baby's chart. Lactation Consultation Note  Patient Name: [name] [id number]'P Date: 07/25/2022   Age:34 hours Mom declined Lactation services.  Maternal Data    Feeding    LATCH Score                    Lactation Tools Discussed/Used    Interventions    Discharge    Consult Status Consult Status: Complete    Kelii Chittum G 07/25/2022, 11:04 PM

## 2022-07-25 NOTE — Anesthesia Preprocedure Evaluation (Signed)
Anesthesia Evaluation  Patient identified by MRN, date of birth, ID band Patient awake    Reviewed: Allergy & Precautions, Patient's Chart, lab work & pertinent test results  Airway Mallampati: II  TM Distance: >3 FB Neck ROM: Full    Dental no notable dental hx. (+) Teeth Intact   Pulmonary neg pulmonary ROS   Pulmonary exam normal breath sounds clear to auscultation       Cardiovascular Normal cardiovascular exam+ dysrhythmias Supra Ventricular Tachycardia  Rhythm:Regular Rate:Normal     Neuro/Psych negative neurological ROS  negative psych ROS   GI/Hepatic Neg liver ROS,GERD  ,,  Endo/Other  Obesity  BRCA2 gene carrier  Renal/GU negative Renal ROS  negative genitourinary   Musculoskeletal negative musculoskeletal ROS (+)    Abdominal  (+) + obese  Peds  Hematology  (+) Blood dyscrasia, anemia   Anesthesia Other Findings   Reproductive/Obstetrics (+) Pregnancy                              Anesthesia Physical Anesthesia Plan  ASA: II  Anesthesia Plan: Epidural   Post-op Pain Management:    Induction:   PONV Risk Score and Plan:   Airway Management Planned: Natural Airway  Additional Equipment:   Intra-op Plan:   Post-operative Plan:   Informed Consent: I have reviewed the patients History and Physical, chart, labs and discussed the procedure including the risks, benefits and alternatives for the proposed anesthesia with the patient or authorized representative who has indicated his/her understanding and acceptance.       Plan Discussed with: Anesthesiologist  Anesthesia Plan Comments:          Anesthesia Quick Evaluation

## 2022-07-25 NOTE — Anesthesia Procedure Notes (Signed)
Epidural Patient location during procedure: OB Start time: 07/25/2022 7:37 PM End time: 07/25/2022 7:51 PM  Staffing Anesthesiologist: Lowella Curb, MD Performed: anesthesiologist   Preanesthetic Checklist Completed: patient identified, IV checked, site marked, risks and benefits discussed, surgical consent, monitors and equipment checked, pre-op evaluation and timeout performed  Epidural Patient position: sitting Prep: ChloraPrep Patient monitoring: heart rate, cardiac monitor, continuous pulse ox and blood pressure Approach: midline Location: L2-L3 Injection technique: LOR saline  Needle:  Needle type: Tuohy  Needle gauge: 17 G Needle length: 9 cm Needle insertion depth: 7 cm Catheter type: closed end flexible Catheter size: 20 Guage Catheter at skin depth: 12 cm Test dose: negative  Assessment Events: blood not aspirated, injection not painful, no injection resistance, no paresthesia and negative IV test  Additional Notes Reason for block:procedure for pain

## 2022-07-25 NOTE — H&P (Cosign Needed Addendum)
OB ADMISSION/ HISTORY & PHYSICAL:  Admission Date: 07/25/2022  6:10 AM  Admit Diagnosis: Encounter for induction of labor [Z34.90]    [name] is a 36 y.o. female presenting for induction at 69.0 gestational age of labor due to Lindsay status.  Supported by spouse, Psychologist, prison and probation services. Expecting baby boy with surprise name.   Prenatal History: [id number]   EDC : 08/01/2022, Date entered prior to episode creation  Prenatal care at [facility] since 11 weeks   Prenatal course complicated by: 1.Rh negative status 2. Anxiety 3. Hidradenitis suppurativa.  4. Greater than average weight gain with LGA status.   Prenatal Labs: ABO, Rh:   O- Antibody: POS (11/21 0720) Rubella: Immune (05/09 0000)  RPR: NON REACTIVE (11/21 0723)  HBsAg: Negative (05/09 0000)  HIV: Non-reactive (05/09 0000)  GBS: Negative/-- (10/31 0000)  1 hr Glucola : 172, passed 3hr Gtt Genetic Screening: :Low risk NIPT Ultrasound: CUS normal XY anatomy.   Vaccines: TDaP          antepartum        Maternal Diabetes: No Genetic Screening: Normal Maternal Ultrasounds/Referrals: Other: fetal heart arrhythmia noted at 32 weeks, peds cardio consult - normal workup.  Fetal Ultrasounds or other Referrals:  Other: Pediatric Cardiology Maternal Substance Abuse:  No Significant Maternal Medications:  Meds include: Other:  Significant Maternal Lab Results:  Group B Strep negative and Rh negative Other Comments:  None  Medical / Surgical History :  Past medical history:  Past Medical History:  Diagnosis Date   BRCA2 positive    History of postpartum hemorrhage    SVT (supraventricular tachycardia)      Past surgical history:  Past Surgical History:  Procedure Laterality Date   BREAST SURGERY     CYSTECTOMY       Family History:  Family History  Problem Relation Age of Onset   Cancer Mother    Cancer Maternal Grandfather    Cancer Paternal Grandmother      Social History:  reports that she has never smoked. She has  never used smokeless tobacco. She reports that she does not drink alcohol and does not use drugs.  Allergies: Carrot oil, Contrast media [iodinated contrast media], Gadobenate, and Lamictal [lamotrigine]   Current Medications at time of admission:  Medications Prior to Admission  Medication Sig Dispense Refill Last Dose   benzocaine-Menthol (DERMOPLAST) 20-0.5 % AERO Apply 1 application topically as needed for irritation (perineal discomfort). (Patient not taking: Reported on 05/26/2022)      coconut oil OIL Apply 1 application topically as needed.  0    cyclobenzaprine (FLEXERIL) 10 MG tablet Take 1 tablet (10 mg total) by mouth 3 (three) times daily as needed for muscle spasms. 30 tablet 0    iron polysaccharides (NIFEREX) 150 MG capsule Take 1 capsule (150 mg total) by mouth daily.      magnesium oxide (MAG-OX) 400 (241.3 Mg) MG tablet Take 1 tablet (400 mg total) by mouth daily. 30 tablet 0    nystatin-triamcinolone ointment (MYCOLOG) Apply topically 2 (two) times daily. 30 g 0      Review of Systems: Review of Systems  Skin:  Positive for rash.  All other systems reviewed and are negative.   Physical Exam: Vital signs and nursing notes reviewed.  Patient Vitals for the past 24 hrs:  BP Temp Temp src Pulse Resp Height Weight  07/25/22 0942 114/82 -- -- (!) 107 -- -- --  07/25/22 0731 123/73 98.6 F (  37 C) Oral (!) 117 16 -- --  07/25/22 0704 -- -- -- -- -- 5' 9.5" (1.765 m) 124.9 kg     General: AAO x 3, NAD, coping well Heart: RRR Lungs:CTAB Abdomen: Gravid, NT, Leopold's LGA, longitudinal lie Extremities: 1+ edema Genitalia / VE: Dilation: 4 Effacement (%): 60 Station: -3 Presentation: Vertex Exam by:: Clarise Cruz, SNM   FHR: 150 BPM, moderate variability, positive accels, no decels TOCO: Ctx 7-8 mins  Labs:   Pending T&S, CBC, RPR  Recent Labs    07/25/22 0723  WBC 11.3*  HGB 10.5*  HCT 31.7*  PLT 188     Assessment/Plan:  35 y.o. [id number] at [redacted]w[redacted]d Fetal  wellbeing - FHT category 1 EFW 8 lbs, AGA  Labor: Induction of labor due to LGA. OP foley balloon placed 11/20 with 640mof fluid, tolerated well, expelled at admission to labor unit. SVE at time of placement 2/50/-2. Plan to administer buccal Cytotec for cervical ripening, stimulation of contractions. Plan AROM for augmentation.   GBS negative.  Rubella Immune Rh negative. RhoGam administered antepartum. Plan cord blood studies to determine PP RhoGam needs.   Pain control: desires unmedicated labor and birth. Hydrotherapy for labor with planned birth out of tub due to LGA status. Pelvis proven to 9lbs 6oz, without shoulder dystocia.  Analgesia/anesthesia PRN  Anticipated MOD: NVSB. Continue position changes to facilitate optimal fetal positioning and provide bedside labor support.  History of PPH: AMSTL planned. Recommend PIV placement, patient declines until active labor.   Plans to breastfeed, decline circumcision. POC discussed with patient and support team, all questions answered.  Dr MoBenjie Karvonenotified of admission / plan of care   SaConan BowensNFaulkner Hospital1/21/2023, 11:58 AM    Medical screening examination/treatment/procedure(s) were conducted as a shared visit with non-physician practitioner(s) and myself.  I personally evaluated the patient during the encounter.   DaJuliene PinaCNM, MSN 07/25/2022, 11:50 PM

## 2022-07-25 NOTE — Progress Notes (Signed)
       S: In tub for 45 min, was in shower for hydrotherapy prior to this, breathing with ctx and coping well. Feels sharp anterior low abdomen pain with ctx.  Anxiety about labor lasting longer and desires epidural.   O: Vitals:   07/25/22 2200 07/25/22 2245 07/25/22 2315 07/25/22 2330  BP: (!) 101/44 113/76 109/66 124/70  Pulse: 96 88 87 88  Resp:      Temp:      TempSrc:      SpO2:      Weight:      Height:         FHT:  160, no audible decels via Doppler UC:   regular, every 2-3 minutes SVE:   7/80/-1, posterior   A / P: Entering active stage of labor after AROM  Fetal Wellbeing:  Category I Pain Control:  Water tub and discussed nitrous and narcotics. Desires epidural now.  Will prep for regional analgesia.  Anticipated MOD:  NSVD  Neta Mends, CNM, MSN 07/25/2022, 7:52 PM

## 2022-07-25 NOTE — Progress Notes (Signed)
       S: Feeling some mild ctx, comfortable. Josh at [facility] and supportive.  Discussed benefit of AROM and agrees.   O: Vitals:   07/25/22 0942 07/25/22 1133 07/25/22 1431 07/25/22 1433  BP: 114/82 105/63 (!) 126/58   Pulse: (!) 107 100 94   Resp:  16 18 16   Temp:  97.9 F (36.6 C)    TempSrc:  Axillary    Weight:      Height:         FHT:  !30's, intermittent, no noted decels.  UC:   irregular SVE:   Dilation: 5 Effacement (%): 60 Station: -1 Exam by:: [email or web address], CNM AROM, clear AF, vertex well applied  A / P: Renae Fickle at [redacted]w[redacted]d for IOL at term, elective LGA with proven pelvis 9'7" Cervical ripening, s/p OF and now 2 doses of buccal cytotec.   Labor progress adequate, will allow for physiologic labor after AROM  Fetal Wellbeing:  Category I Pain Control:  Labor support without medications and plan water immersion with active labor  Anticipated MOD:  NSVD  [redacted]w[redacted]d, CNM, MSN 07/25/2022, 3:26 PM

## 2022-07-25 NOTE — Progress Notes (Signed)
       S: Doing well, pain well tolerated, declines  Epidural pelvic pressure absent    O: Vitals:   07/25/22 0704 07/25/22 0731 07/25/22 0942  BP:  123/73 114/82  Pulse:  (!) 117 (!) 107  Resp:  16   Temp:  98.6 F (37 C)   TempSrc:  Oral   Weight: 124.9 kg    Height: 5' 9.5" (1.765 m)       FHT:  FHR: 145 bpm, variability: moderate,  accelerations:  Present UC:   7 mins SVE:   Dilation: 4 Effacement (%): 60 Station: -3 Exam by:: Huntley Dec, SNM   A / P: Induction of labor due to LGA,  progressing well with administration of cytotec, planning AROM to augment PRN.   Fetal Wellbeing:   reassuring per NST on admission and intermittent auscultation via doppler Pain Control:   hydrotherapy in active labor, position changes, bedside support and breathing.   Anticipated MOD:   NVSB  Lamont Snowball, SNM 07/25/2022, 11:58 AM

## 2022-07-25 NOTE — Progress Notes (Addendum)
       S: Doing well, pain well tolerated declines epidural pelvic pressure absent.  Spouse, Josh at bedside and supportive.   O: Vitals:   07/25/22 0942 07/25/22 1133 07/25/22 1431 07/25/22 1433  BP: 114/82 105/63 (!) 126/58   Pulse: (!) 107 100 94   Resp:  16 18 16   Temp:  97.9 F (36.6 C)    TempSrc:  Axillary    Weight:      Height:         FHT:   135 BPM, moderate variability, 10x10 accelerations, no decelerations UC:   irregular, mild SVE:   deferred d/t cervical ripening.    A / P: Induction of labor due to LGA,  progressing well post OP foley balloon, with buccal Cytotec for cervical ripening, and stimulation of contractions with planned AROM.   Fetal Wellbeing:   reassuring with intermittent auscultation Pain Control:  Labor support without medications, continue position changes, plan hydrotherapy once labor active, breathing and bedside support.  Anticipated MOD:   NVSB  , SNM 07/25/2022, 2:37 PM

## 2022-07-26 ENCOUNTER — Encounter (HOSPITAL_COMMUNITY): Payer: Self-pay

## 2022-07-26 DIAGNOSIS — O9902 Anemia complicating childbirth: Secondary | ICD-10-CM | POA: Diagnosis not present

## 2022-07-26 LAB — CBC
HCT: 28.8 % — ABNORMAL LOW (ref 36.0–46.0)
Hemoglobin: 9.6 g/dL — ABNORMAL LOW (ref 12.0–15.0)
MCH: 28.3 pg (ref 26.0–34.0)
MCHC: 33.3 g/dL (ref 30.0–36.0)
MCV: 85 fL (ref 80.0–100.0)
Platelets: 182 10*3/uL (ref 150–400)
RBC: 3.39 MIL/uL — ABNORMAL LOW (ref 3.87–5.11)
RDW: 14.1 % (ref 11.5–15.5)
WBC: 14.1 10*3/uL — ABNORMAL HIGH (ref 4.0–10.5)
nRBC: 0 % (ref 0.0–0.2)

## 2022-07-26 MED ORDER — WITCH HAZEL-GLYCERIN EX PADS: 1.0000 | MEDICATED_PAD | CUTANEOUS | Status: DC | PRN

## 2022-07-26 MED ORDER — ACETAMINOPHEN 325 MG PO TABS: 650.0000 mg | ORAL_TABLET | ORAL | Status: DC | PRN

## 2022-07-26 MED ORDER — BENZOCAINE-MENTHOL 20-0.5 % EX AERO: 1.0000 | INHALATION_SPRAY | CUTANEOUS | Status: DC | PRN

## 2022-07-26 MED ORDER — ONDANSETRON HCL 4 MG PO TABS: 4.0000 mg | ORAL_TABLET | ORAL | Status: DC | PRN

## 2022-07-26 MED ORDER — MAGNESIUM OXIDE -MG SUPPLEMENT 400 (240 MG) MG PO TABS
400.0000 mg | ORAL_TABLET | Freq: Every day | ORAL | Status: DC
  Administered 2022-07-26 – 2022-07-27 (×2): 400 mg via ORAL
  Filled 2022-07-26 (×2): qty 1

## 2022-07-26 MED ORDER — ONDANSETRON HCL 4 MG/2ML IJ SOLN: 4.0000 mg | INTRAMUSCULAR | Status: DC | PRN

## 2022-07-26 MED ORDER — RHO D IMMUNE GLOBULIN 1500 UNIT/2ML IJ SOSY
300.0000 ug | PREFILLED_SYRINGE | Freq: Once | INTRAMUSCULAR | Status: AC
  Administered 2022-07-26: 300 ug via INTRAMUSCULAR
  Filled 2022-07-26: qty 2

## 2022-07-26 MED ORDER — DIPHENHYDRAMINE HCL 25 MG PO CAPS: 25.0000 mg | ORAL_CAPSULE | Freq: Four times a day (QID) | ORAL | Status: DC | PRN

## 2022-07-26 MED ORDER — POLYSACCHARIDE IRON COMPLEX 150 MG PO CAPS
150.0000 mg | ORAL_CAPSULE | Freq: Every day | ORAL | Status: DC
  Administered 2022-07-26 – 2022-07-27 (×2): 150 mg via ORAL
  Filled 2022-07-26 (×2): qty 1

## 2022-07-26 MED ORDER — TETANUS-DIPHTH-ACELL PERTUSSIS 5-2.5-18.5 LF-MCG/0.5 IM SUSY: 0.5000 mL | PREFILLED_SYRINGE | Freq: Once | INTRAMUSCULAR | Status: DC

## 2022-07-26 MED ORDER — SENNOSIDES-DOCUSATE SODIUM 8.6-50 MG PO TABS
2.0000 | ORAL_TABLET | Freq: Every day | ORAL | Status: DC
  Administered 2022-07-26: 2 via ORAL
  Filled 2022-07-26 (×3): qty 2

## 2022-07-26 MED ORDER — COCONUT OIL OIL: 1.0000 | TOPICAL_OIL | Status: DC | PRN

## 2022-07-26 MED ORDER — DIBUCAINE (PERIANAL) 1 % EX OINT: 1.0000 | TOPICAL_OINTMENT | CUTANEOUS | Status: DC | PRN

## 2022-07-26 MED ORDER — SIMETHICONE 80 MG PO CHEW: 80.0000 mg | CHEWABLE_TABLET | ORAL | Status: DC | PRN

## 2022-07-26 MED ORDER — PRENATAL MULTIVITAMIN CH
1.0000 | ORAL_TABLET | Freq: Every day | ORAL | Status: DC
  Filled 2022-07-26 (×2): qty 1

## 2022-07-26 MED ORDER — IBUPROFEN 600 MG PO TABS
600.0000 mg | ORAL_TABLET | Freq: Four times a day (QID) | ORAL | Status: DC
  Administered 2022-07-26 – 2022-07-27 (×6): 600 mg via ORAL
  Filled 2022-07-26 (×6): qty 1

## 2022-07-26 NOTE — Social Work (Signed)
CSW received consult for hx of Anxiety. CSW met with MOB to offer support and complete assessment. CSW entered the room and observed MOB on the bed nursing the infant and FOB nearby on the couch. CSW introduced self, CSW role and reason for visit. MOB was agreeable to visit. MOB granted CSW verbal permission to speak in front of FOB. CSW inquired about how MOB was feeling MOB reported good.   CSW inquired about MOB MH hx, MOB denied hx of anxiety. MOB stated " there are situation in my life that make an more anxious but no diagnosed anxiety". CSW voiced understanding. CSW assessed for safety, MOB denied any SI or HI. MOB identified FOB and family as her support. CSW provided education regarding the baby blues period vs. perinatal mood disorders, discussed treatment and gave resources for mental health follow up if concerns arise.  CSW recommends self-evaluation during the postpartum time period using the New Mom Checklist from Postpartum Progress and encouraged MOB to contact a medical professional if symptoms are noted at any time.    CSW provided review of Sudden Infant Death Syndrome (SIDS) precautions.  MOB identified [facility] for infants follow up care. MOB reported they have all necessary items for the infant including a bassinet for him to sleep. CSW identifies no further need for intervention and no barriers to discharge at this time.  Letta Kocher, [place] Social Worker [phone]

## 2022-07-26 NOTE — Anesthesia Postprocedure Evaluation (Signed)
Anesthesia Post Note  Patient: [name]  Procedure(s) Performed: AN AD HOC LABOR EPIDURAL     Patient location during evaluation: Mother Baby Anesthesia Type: Epidural Level of consciousness: awake Pain management: satisfactory to patient Vital Signs Assessment: post-procedure vital signs reviewed and stable Respiratory status: spontaneous breathing Cardiovascular status: stable Anesthetic complications: no   No notable events documented.  Last Vitals:  Vitals:   07/26/22 0150 07/26/22 0428  BP: 127/72 133/65  Pulse: 76 89  Resp: 18 18  Temp: 36.6 C 36.6 C  SpO2: 98% 99%    Last Pain:  Vitals:   07/26/22 0715  TempSrc:   PainSc: 0-No pain   Pain Goal:                   [employer]

## 2022-07-26 NOTE — Progress Notes (Signed)
   PPD #1 S/P NSVD  Live born female  Birth Weight: 8 lb 14.5 oz (4040 g) APGAR: 9, 9  Newborn Delivery   Birth date/time: 07/25/2022 22:32:08 Delivery type: Vaginal, Spontaneous     Baby name: Beau  Delivering provider: Lamont Snowball A   Lacerations: None   Circumcision: Declines  Feeding: breast  Pain control at delivery: Epidural   S:  Reports feeling well. Very happy.              Tolerating PO/No nausea or vomiting             Bleeding is light             Pain controlled with acetaminophen and ibuprofen (OTC)             Up ad lib/ambulatory/voiding without difficulties   O:  A & O x 3, in no apparent distress  Vitals:   07/26/22 0050 07/26/22 0150 07/26/22 0428 07/26/22 1100  BP: 133/71 127/72 133/65 114/72  Pulse: 77 76 89 72  Resp: 18 18 18 18   Temp: (!) 97.5 F (36.4 C) 97.9 F (36.6 C) 97.9 F (36.6 C) 97.6 F (36.4 C)  TempSrc: Oral Oral Axillary Oral  SpO2: 99% 98% 99% 98%  Weight:      Height:       Recent Labs    07/25/22 0723 07/26/22 0551  WBC 11.3* 14.1*  HGB 10.5* 9.6*  HCT 31.7* 28.8*  PLT 188 182    Blood type: --/--/O NEG (11/21 0720)  Rubella: Immune (05/09 0000)   I&O: I/O last 3 completed shifts: In: -  Out: 328 [Urine:150; Blood:178]          No intake/output data recorded.  Gen: AAO x 3, NAD Abdomen: soft, non-tender, non-distended Fundus: firm, non-tender, U-1 Perineum: intact Lochia: small Extremities: no edema, no calf pain or tenderness   A/P:  PPD # 1 36 y.o., 20  Principal Problem:   Postpartum care following vaginal delivery 11/21  Doing well - stable status  Routine post partum orders Active Problems:   Encounter for induction of labor   SVD (spontaneous vaginal delivery)   Maternal anemia, with delivery  Discussed perineal care and comfort measures.   Anticipate discharge tomorrow.   12/21, MSN, CNM 07/26/2022, 11:23 AM

## 2022-07-27 LAB — RH IG WORKUP (INCLUDES ABO/RH)
Fetal Screen: NEGATIVE
Gestational Age(Wks): 39
Unit division: 0

## 2022-07-27 MED ORDER — ACETAMINOPHEN 325 MG PO TABS: 650.0000 mg | ORAL_TABLET | ORAL | Status: DC | PRN

## 2022-07-27 MED ORDER — IBUPROFEN 600 MG PO TABS: 600.0000 mg | ORAL_TABLET | Freq: Four times a day (QID) | ORAL | 0 refills | Status: DC

## 2022-07-27 NOTE — Discharge Summary (Signed)
Postpartum Discharge Summary  Date of Service updated 07/27/22     Patient Name: [name] DOB: [birth year] MRN: [id number]  Date of admission: 07/25/2022 Delivery date:07/25/2022  Delivering provider: Conan Bowens A  Date of discharge: 07/27/2022  Admitting diagnosis: Encounter for induction of labor [Z34.90] Intrauterine pregnancy: [redacted]w[redacted]d    Secondary diagnosis:  Principal Problem:   Postpartum care following vaginal delivery 11/21 Active Problems:   Encounter for induction of labor   SVD (spontaneous vaginal delivery)   Maternal anemia, with delivery  Additional problems: N/A    Discharge diagnosis: Term Pregnancy Delivered                                              Post partum procedures: n/a Augmentation: OP Foley Complications: None  Hospital course: Onset of Labor With Vaginal Delivery      36y.o. yo [id number] admitted in Active Labor on 07/25/2022. Labor course was complicated by n/a  Membrane Rupture Time/Date: 3:20 PM ,07/25/2022   Delivery Method:Vaginal, Spontaneous  Episiotomy: None  Lacerations:  None  Patient had a postpartum course complicated by n/a .  She is ambulating, tolerating a regular diet, passing flatus, and urinating well. Patient is discharged home in stable condition on 07/27/22.  Newborn Data: Birth date:07/25/2022  Birth time:10:32 PM  Gender:Female  Living status:Living  Apgars:9 ,9  Weight:4040 g   Magnesium Sulfate received: No BMZ received: No Rhophylac:N/A MMR:N/A  Physical exam  Vitals:   07/26/22 1100 07/26/22 1600 07/26/22 2110 07/27/22 0540  BP: 114/72 113/72 115/70 119/67  Pulse: 72 99 90 87  Resp: _0 Temp: 97.6 F (36.4 C) 97.9 F (36.6 C) 98 F (36.7 C) 98 F (36.7 C)  TempSrc: Oral Oral Oral Oral  SpO2: 98% 99% 99% 100%  Weight:      Height:       General: alert and no distress Lochia: appropriate Uterine Fundus: firm Incision: N/A DVT Evaluation: No evidence of DVT  seen on physical exam. Labs: Lab Results  Component Value Date   WBC 14.1 (H) 07/26/2022   HGB 9.6 (L) 07/26/2022   HCT 28.8 (L) 07/26/2022   MCV 85.0 07/26/2022   PLT 182 07/26/2022      02/19/2007   12:00 PM  CMP  Glucose 92   BUN 9   Creatinine 0.65   Sodium 137   Potassium 3.4   Chloride 104   CO2 25   Calcium 8.9    Edinburgh Score:    07/26/2022   10:05 AM  Edinburgh Postnatal Depression Scale Screening Tool  I have been able to laugh and see the funny side of things. 0  I have looked forward with enjoyment to things. 0  I have blamed myself unnecessarily when things went wrong. 1  I have been anxious or worried for no good reason. 0  I have felt scared or panicky for no good reason. 0  Things have been getting on top of me. 0  I have been so unhappy that I have had difficulty sleeping. 0  I have felt sad or miserable. 0  I have been so unhappy that I have been crying. 0  The thought of harming myself has occurred to me. 0  Edinburgh Postnatal Depression Scale Total 1      After visit  meds:  Allergies as of 07/27/2022       Reactions   Carrot Oil Anaphylaxis   Contrast Media [iodinated Contrast Media] Anaphylaxis   Gadobenate Shortness Of Breath   Experienced difficulty breathing   Lamictal [lamotrigine] Anaphylaxis        Medication List     STOP taking these medications    cyclobenzaprine 10 MG tablet Commonly known as: FLEXERIL   nystatin-triamcinolone ointment Commonly known as: MYCOLOG       TAKE these medications    acetaminophen 325 MG tablet Commonly known as: Tylenol Take 2 tablets (650 mg total) by mouth every 4 (four) hours as needed (for pain scale < 4).   benzocaine-Menthol 20-0.5 % Aero Commonly known as: DERMOPLAST Apply 1 application topically as needed for irritation (perineal discomfort).   coconut oil Oil Apply 1 application topically as needed.   ibuprofen 600 MG tablet Commonly known as: ADVIL Take 1 tablet  (600 mg total) by mouth every 6 (six) hours.   iron polysaccharides 150 MG capsule Commonly known as: NIFEREX Take 1 capsule (150 mg total) by mouth daily.   magnesium oxide 400 (241.3 Mg) MG tablet Commonly known as: MAG-OX Take 1 tablet (400 mg total) by mouth daily.               Discharge Care Instructions  (From admission, onward)           Start     Ordered   07/27/22 0000  Discharge wound care:       Comments: Sitz baths 2 times /day with warm water x 1 week. May add herbals: 1 ounce dried comfrey leaf* 1 ounce calendula flowers 1 ounce lavender flowers  Supplies can be found online at [employer] sources at [employer], [employer]  1/2 ounce dried uva ursi leaves 1/2 ounce witch hazel blossoms (if you can find them) 1/2 ounce dried sage leaf 1/2 cup sea salt Directions: Bring 2 quarts of water to a boil. Turn off heat, and place 1 ounce (approximately 1 large handful) of the above mixed herbs (not the salt) into the pot. Steep, covered, for 30 minutes.  Strain the liquid well with a fine mesh strainer, and discard the herb material. Add 2 quarts of liquid to the tub, along with the 1/2 cup of salt. This medicinal liquid can also be made into compresses and peri-rinses.   07/27/22 1040             Discharge home in stable condition Infant Feeding: Breast Infant Disposition:home with mother Discharge instruction: per After Visit Summary and Postpartum booklet. Activity: Advance as tolerated. Pelvic rest for 6 weeks.  Diet: routine diet Anticipated Birth Control: Unsure Postpartum Appointment:6 weeks Additional Postpartum F/U:  n/a Future Appointments:No future appointments. Follow up Visit: Patient to follow up with CNM's at [facility] in 6 weeks or sooner as needed      07/27/2022 Nancee Brownrigg A Tramya Schoenfelder, DO

## 2022-08-02 ENCOUNTER — Telehealth (HOSPITAL_COMMUNITY): Payer: Self-pay | Admitting: *Deleted

## 2022-08-02 NOTE — Telephone Encounter (Signed)
Left phone voicemail message.  Duffy Rhody, RN 08-02-2022 at 9:47am

## 2024-05-08 ENCOUNTER — Emergency Department (HOSPITAL_BASED_OUTPATIENT_CLINIC_OR_DEPARTMENT_OTHER)
Admission: EM | Admit: 2024-05-08 | Discharge: 2024-05-08 | Disposition: A | Discharge status: Home / Self Care | Attending: Emergency Medicine | Admitting: Emergency Medicine

## 2024-05-08 ENCOUNTER — Other Ambulatory Visit: Payer: Self-pay

## 2024-05-08 ENCOUNTER — Emergency Department (HOSPITAL_BASED_OUTPATIENT_CLINIC_OR_DEPARTMENT_OTHER)

## 2024-05-08 DIAGNOSIS — R42 Dizziness and giddiness: Secondary | ICD-10-CM | POA: Insufficient documentation

## 2024-05-08 DIAGNOSIS — R000 Tachycardia, unspecified: Secondary | ICD-10-CM | POA: Insufficient documentation

## 2024-05-08 DIAGNOSIS — Z79899 Other long term (current) drug therapy: Secondary | ICD-10-CM | POA: Insufficient documentation

## 2024-05-08 DIAGNOSIS — R110 Nausea: Secondary | ICD-10-CM | POA: Insufficient documentation

## 2024-05-08 LAB — COMPREHENSIVE METABOLIC PANEL WITH GFR
ALT: 17 U/L (ref 0–44)
AST: 20 U/L (ref 15–41)
Albumin: 4.4 g/dL (ref 3.5–5.0)
Alkaline Phosphatase: 100 U/L (ref 38–126)
Anion gap: 12 (ref 5–15)
BUN: 10 mg/dL (ref 6–20)
CO2: 25 mmol/L (ref 22–32)
Calcium: 10 mg/dL (ref 8.9–10.3)
Chloride: 102 mmol/L (ref 98–111)
Creatinine, Ser: 0.77 mg/dL (ref 0.44–1.00)
GFR, Estimated: >60 mL/min (ref >60)
Glucose, Bld: 117 mg/dL — ABNORMAL HIGH (ref 70–99)
Potassium: 4.2 mmol/L (ref 3.5–5.1)
Sodium: 139 mmol/L (ref 135–145)
Total Bilirubin: 0.8 mg/dL (ref 0.0–1.2)
Total Protein: 7.5 g/dL (ref 6.5–8.1)

## 2024-05-08 LAB — URINALYSIS, ROUTINE W REFLEX MICROSCOPIC
Bilirubin Urine: NEGATIVE
Glucose, UA: NEGATIVE mg/dL
Hgb urine dipstick: NEGATIVE
Ketones, ur: NEGATIVE mg/dL
Leukocytes,Ua: NEGATIVE
Nitrite: NEGATIVE
Protein, ur: NEGATIVE mg/dL
Specific Gravity, Urine: 1.012 (ref 1.005–1.030)
pH: 6.5 (ref 5.0–8.0)

## 2024-05-08 LAB — CBC
HCT: 41.2 % (ref 36.0–46.0)
Hemoglobin: 14.2 g/dL (ref 12.0–15.0)
MCH: 30.9 pg (ref 26.0–34.0)
MCHC: 34.5 g/dL (ref 30.0–36.0)
MCV: 89.6 fL (ref 80.0–100.0)
Platelets: 261 K/uL (ref 150–400)
RBC: 4.6 MIL/uL (ref 3.87–5.11)
RDW: 12.5 % (ref 11.5–15.5)
WBC: 8.8 K/uL (ref 4.0–10.5)
nRBC: 0 % (ref 0.0–0.2)

## 2024-05-08 LAB — PREGNANCY, URINE: Preg Test, Ur: NEGATIVE

## 2024-05-08 LAB — TSH: TSH: 1.31 u[IU]/mL (ref 0.350–4.500)

## 2024-05-08 LAB — MAGNESIUM: Magnesium: 2 mg/dL (ref 1.7–2.4)

## 2024-05-08 LAB — CBG MONITORING, ED: Glucose-Capillary: 109 mg/dL — ABNORMAL HIGH (ref 70–99)

## 2024-05-08 MED ORDER — MECLIZINE HCL 25 MG PO TABS
25.0000 mg | ORAL_TABLET | Freq: Once | ORAL | Status: AC
Start: 2024-05-08 — End: 2024-05-08
  Administered 2024-05-08: 25 mg via ORAL
  Filled 2024-05-08: qty 1

## 2024-05-08 MED ORDER — PROCHLORPERAZINE MALEATE 10 MG PO TABS
10.0000 mg | ORAL_TABLET | Freq: Once | ORAL | Status: AC
  Administered 2024-05-08: 10 mg via ORAL
  Filled 2024-05-08: qty 1

## 2024-05-08 MED ORDER — MECLIZINE HCL 25 MG PO TABS
25.0000 mg | ORAL_TABLET | Freq: Three times a day (TID) | ORAL | 0 refills | Status: AC | PRN
Start: 2024-05-08 — End: ?

## 2024-05-08 MED ORDER — DIPHENHYDRAMINE HCL 50 MG/ML IJ SOLN
25.0000 mg | Freq: Once | INTRAMUSCULAR | Status: AC
  Administered 2024-05-08: 25 mg via INTRAVENOUS
  Filled 2024-05-08: qty 1

## 2024-05-08 MED ORDER — SODIUM CHLORIDE 0.9 % IV BOLUS
1000.0000 mL | Freq: Once | INTRAVENOUS | Status: AC
  Administered 2024-05-08: 1000 mL via INTRAVENOUS

## 2024-05-08 NOTE — ED Notes (Signed)
 ED Provider at bedside.

## 2024-05-08 NOTE — Discharge Instructions (Addendum)
 Your CT scan and lab work today were reassuring.  Your symptoms are most likely caused by benign positional vertigo.  Do the Epley maneuvers detailed on your paperwork today multiple times a day.  You can use prescribed meclizine  as needed for dizziness.  Follow-up closely with your primary care provider if symptoms do not resolve.  Return if you develop persistent or worsening dizziness, visual changes, severe headache, vomiting or other new or concerning symptoms.

## 2024-05-08 NOTE — ED Notes (Signed)
 Reviewed discharge instructions, medications, and home care with pt. Pt verbalized understanding and had no further questions. Pt exited ED without complications.

## 2024-05-08 NOTE — ED Triage Notes (Signed)
 Patient reports dizzy spell about an hour ago. States she felt like she was going to pass out. Still endorses dizziness but states it is better. Patient reports that during initial episode her arms and legs were tingling.

## 2024-05-08 NOTE — ED Notes (Signed)
 Discharge instructions, follow up care, and prescription reviewed and explained, pt verbalized understanding and had no further questions on d/c. Pt caox4, ambulatory NAD on d/c.

## 2024-05-08 NOTE — ED Provider Notes (Signed)
 [place] EMERGENCY DEPARTMENT AT [facility] Provider Note   CSN: [id number] Arrival date & time: 05/08/24  1407     Patient presents with: Dizziness   [name] is a 38 y.o. female.   [name] is a 38 y.o. female who is otherwise healthy, presents to the emergency department for evaluation of dizziness.  She reports she began experiencing dizziness about an hour ago.  She reports that it felt like a room spinning sensation.  She reports it is persistent and has not gone away but has lessened in intensity.  She denies feeling like she was going to pass out but more so like everything around her was moving.  It is worse with position changes.  She denies any associated vision changes.  She does report that she had some tingling in bilateral arms and legs when the initial episode began.  She does have a history of migraines that sometimes have aura and associated neurologic symptoms but denies any current headache symptoms.  No associated chest pain or palpitations, no shortness of breath.  Patient does report some associated nausea without vomiting.  She has never had similar dizziness.  Denies any recent head injury.  No other aggravating or alleviating factors.   Dizziness Associated symptoms: nausea   Associated symptoms: no chest pain, no headaches, no palpitations and no weakness        Prior to Admission medications   Medication Sig Start Date End Date Taking? Authorizing Provider  meclizine  (ANTIVERT ) 25 MG tablet Take 1 tablet (25 mg total) by mouth 3 (three) times daily as needed for dizziness. 05/08/24  Yes Jaylea Plourde F, PA-C  acetaminophen  (TYLENOL ) 325 MG tablet Take 2 tablets (650 mg total) by mouth every 4 (four) hours as needed (for pain scale < 4). 07/27/22   Law, Cassandra A, DO  benzocaine -Menthol  (DERMOPLAST) 20-0.5 % AERO Apply 1 application topically as needed for irritation (perineal discomfort). Patient not taking: Reported on 05/26/2022  04/29/20   Sigmon, Meredith C, CNM  coconut oil OIL Apply 1 application topically as needed. 04/29/20   Sigmon, Hoy BROCKS, CNM  ibuprofen  (ADVIL ) 600 MG tablet Take 1 tablet (600 mg total) by mouth every 6 (six) hours. 07/27/22   Law, Cassandra A, DO  iron  polysaccharides (NIFEREX) 150 MG capsule Take 1 capsule (150 mg total) by mouth daily. 04/29/20   Sigmon, Meredith C, CNM  magnesium  oxide (MAG-OX) 400 (241.3 Mg) MG tablet Take 1 tablet (400 mg total) by mouth daily. 04/29/20   Sigmon, Meredith C, CNM    Allergies: Carrot oil, Contrast media [iodinated contrast media], Gadobenate, Lamictal [lamotrigine], and Sulfamethoxazole-trimethoprim    Review of Systems  Constitutional:  Negative for chills and fever.  Eyes:  Negative for visual disturbance.  Cardiovascular:  Negative for chest pain and palpitations.  Gastrointestinal:  Positive for nausea.  Genitourinary:  Negative for dysuria and frequency.  Neurological:  Positive for dizziness. Negative for seizures, syncope, facial asymmetry, speech difficulty, weakness, light-headedness, numbness and headaches.    Updated Vital Signs BP 129/85 (BP Location: Right Arm)   Pulse (!) 118   Temp 98 F (36.7 C) (Oral)   Resp 15   SpO2 100%   Physical Exam Vitals and nursing note reviewed.  Constitutional:      General: She is not in acute distress.    Appearance: Normal appearance. She is well-developed. She is not diaphoretic.  HENT:     Head: Normocephalic and atraumatic.  Eyes:  General:        Right eye: No discharge.        Left eye: No discharge.     Extraocular Movements: Extraocular movements intact.     Pupils: Pupils are equal, round, and reactive to light.     Comments: No nystagmus  Cardiovascular:     Rate and Rhythm: Normal rate and regular rhythm.     Pulses: Normal pulses.     Heart sounds: Normal heart sounds.  Pulmonary:     Effort: Pulmonary effort is normal. No respiratory distress.     Breath sounds: Normal  breath sounds. No wheezing or rales.     Comments: Respirations equal and unlabored, patient able to speak in full sentences, lungs clear to auscultation bilaterally  Abdominal:     General: Bowel sounds are normal. There is no distension.     Palpations: Abdomen is soft. There is no mass.     Tenderness: There is no abdominal tenderness. There is no guarding.     Comments: Abdomen soft, nondistended, nontender to palpation in all quadrants without guarding or peritoneal signs  Musculoskeletal:        General: No deformity.     Cervical back: Neck supple.  Skin:    General: Skin is warm and dry.     Capillary Refill: Capillary refill takes less than 2 seconds.  Neurological:     Mental Status: She is alert and oriented to person, place, and time.     Coordination: Coordination normal.     Comments: Neurological Exam:  Mental Status: Alert and oriented to person, place, and time. Attention and concentration normal. Speech clear. Recent memory is intact. Follows commands. Cranial Nerves: Visual fields grossly intact. EOMI and PERRLA. No nystagmus noted. Facial sensation intact at forehead, maxillary cheek, and chin/mandible bilaterally. No facial asymmetry or weakness. Hearing grossly normal. Uvula is midline, and palate elevates symmetrically. Normal SCM and trapezius strength. Tongue midline without fasciculations. Motor: Muscle strength 5/5 in proximal and distal UE and LE bilaterally. No pronator drift. Muscle tone normal. Reflexes: 2+ and symmetrical in all four extremities.  Sensation: Intact to light touch in upper and lower extremities distally bilaterally.  Coordination: Normal FTN bilaterally. Normal heel to shin bilaterally, normal rapid alternating movements, no pronator drift.  Psychiatric:        Mood and Affect: Mood normal.        Behavior: Behavior normal.     (all labs ordered are listed, but only abnormal results are displayed) Labs Reviewed  COMPREHENSIVE METABOLIC  PANEL WITH GFR - Abnormal; Notable for the following components:      Result Value   Glucose, Bld 117 (*)    All other components within normal limits  CBG MONITORING, ED - Abnormal; Notable for the following components:   Glucose-Capillary 109 (*)    All other components within normal limits  CBC  URINALYSIS, ROUTINE W REFLEX MICROSCOPIC  PREGNANCY, URINE  MAGNESIUM   TSH    EKG: EKG Interpretation Date/Time:  Thursday May 08 2024 14:21:32 EDT Ventricular Rate:  113 PR Interval:  141 QRS Duration:  86 QT Interval:  321 QTC Calculation: 441 R Axis:   61  Text Interpretation: Sinus tachycardia Low voltage, precordial leads Anteroseptal infarct, old No previous ECGs available Confirmed by Zackowski, Scott [phone]) on 05/08/2024 5:11:09 PM  Radiology: CT Head Wo Contrast Result Date: 05/08/2024 CLINICAL DATA:  Vertigo, central EXAM: CT HEAD WITHOUT CONTRAST TECHNIQUE: Contiguous axial images were obtained from the base  of the skull through the vertex without intravenous contrast. RADIATION DOSE REDUCTION: This exam was performed according to the departmental dose-optimization program which includes automated exposure control, adjustment of the mA and/or kV according to patient size and/or use of iterative reconstruction technique. COMPARISON:  None Available. FINDINGS: Brain: No intracranial hemorrhage, mass effect, or midline shift. No hydrocephalus. The basilar cisterns are patent. No evidence of territorial infarct or acute ischemia. No extra-axial or intracranial fluid collection. Vascular: No hyperdense vessel or unexpected calcification. Skull: Normal. Negative for fracture or focal lesion. Sinuses/Orbits: Small mucous retention cyst in left side of sphenoid sinus. No mastoid effusion. Other: None. IMPRESSION: Negative noncontrast head CT. Electronically Signed   By: Andrea Gasman M.D.   On: 05/08/2024 17:51     Procedures   Medications Ordered in the ED  sodium chloride  0.9  % bolus 1,000 mL (0 mLs Intravenous Stopped 05/08/24 1823)  prochlorperazine  (COMPAZINE ) tablet 10 mg (10 mg Oral Given 05/08/24 1655)  diphenhydrAMINE  (BENADRYL ) injection 25 mg (25 mg Intravenous Given 05/08/24 1655)  meclizine  (ANTIVERT ) tablet 25 mg (25 mg Oral Given 05/08/24 1819)                                    Medical Decision Making Amount and/or Complexity of Data Reviewed Labs: ordered. Radiology: ordered.  Risk Prescription drug management.   38 year old female presents with sudden onset dizziness.  Worse with position changes.  No focal neurologic deficits noted.  Does have underlying history of migraines with aura but is not currently having associated headache symptoms.  No history of similar.  No head trauma.  Differential includes: BPPV, peripheral vertigo, central causes of vertigo such as intracranial mass or stroke although less likely without risk factors.,  Electrolyte derangement, thyroid  derangement.  EKG shows sinus tachycardia with no other concerning changes.  Heart rate returned to normal without intervention.  Labs show no leukocytosis and normal hemoglobin, no significant electrolyte derangements, normal renal and liver function, normal magnesium  and TSH.  Urine pregnancy is negative and urinalysis without signs of infection.  CT head reviewed interpreted dependently, no acute intracranial abnormalities noted.  Patient given IV Compazine , Benadryl  and fluid bolus and reports significant improvement in dizziness, still has some when turning the head.  Meclizine  given.  After this dizziness continues to improve and patient is ambulatory in the department.  Suspect peripheral etiology for vertigo given reassuring exam and workup.  Provided education on Epley maneuvers and given prescription for meclizine .  Discussed close follow-up if symptoms are not resolving as well as return precautions.  At this time there does not appear to be any evidence of an acute  emergency medical condition requiring further emergent evaluation and the patient appears stable for discharge with appropriate outpatient follow up. Diagnosis and return precautions discussed with patient who verbalizes understanding and is agreeable to discharge.        Final diagnoses:  Vertigo    ED Discharge Orders          Ordered    meclizine  (ANTIVERT ) 25 MG tablet  3 times daily PRN        05/08/24 1819               Alva Larraine FALCON, PA-C 05/14/24 1622    Zackowski, Scott, MD 05/19/24 [phone]

## 2024-06-17 DIAGNOSIS — Z6791 Unspecified blood type, Rh negative: Secondary | ICD-10-CM | POA: Insufficient documentation

## 2024-06-20 ENCOUNTER — Encounter: Payer: Self-pay | Admitting: Student in an Organized Health Care Education/Training Program

## 2024-06-20 ENCOUNTER — Ambulatory Visit: Admitting: Student in an Organized Health Care Education/Training Program

## 2024-06-20 ENCOUNTER — Telehealth: Payer: Self-pay

## 2024-06-20 ENCOUNTER — Other Ambulatory Visit (HOSPITAL_COMMUNITY): Payer: Self-pay

## 2024-06-20 ENCOUNTER — Other Ambulatory Visit (HOSPITAL_BASED_OUTPATIENT_CLINIC_OR_DEPARTMENT_OTHER): Payer: Self-pay

## 2024-06-20 VITALS — BP 113/57 | HR 85 | Ht 68.5 in | Wt 269.0 lb

## 2024-06-20 DIAGNOSIS — Z13220 Encounter for screening for lipoid disorders: Secondary | ICD-10-CM | POA: Diagnosis not present

## 2024-06-20 DIAGNOSIS — L732 Hidradenitis suppurativa: Secondary | ICD-10-CM | POA: Diagnosis not present

## 2024-06-20 DIAGNOSIS — Z131 Encounter for screening for diabetes mellitus: Secondary | ICD-10-CM

## 2024-06-20 DIAGNOSIS — Z1589 Genetic susceptibility to other disease: Secondary | ICD-10-CM

## 2024-06-20 DIAGNOSIS — F411 Generalized anxiety disorder: Secondary | ICD-10-CM

## 2024-06-20 DIAGNOSIS — E66813 Obesity, class 3: Secondary | ICD-10-CM | POA: Insufficient documentation

## 2024-06-20 DIAGNOSIS — Z1509 Genetic susceptibility to other malignant neoplasm: Secondary | ICD-10-CM

## 2024-06-20 DIAGNOSIS — Z15068 Genetic susceptibility to other malignant neoplasm of digestive system: Secondary | ICD-10-CM

## 2024-06-20 DIAGNOSIS — Z1501 Genetic susceptibility to malignant neoplasm of breast: Secondary | ICD-10-CM | POA: Diagnosis not present

## 2024-06-20 DIAGNOSIS — Z1507 Genetic susceptibility to malignant neoplasm of urinary tract: Secondary | ICD-10-CM

## 2024-06-20 LAB — LIPID PANEL
Cholesterol: 167 mg/dL (ref 0–200)
HDL: 40.9 mg/dL (ref >39.00)
LDL Cholesterol: 78 mg/dL (ref 0–99)
NonHDL: 126.2
Total CHOL/HDL Ratio: 4
Triglycerides: 239 mg/dL — ABNORMAL HIGH (ref 0.0–149.0)
VLDL: 47.8 mg/dL — ABNORMAL HIGH (ref 0.0–40.0)

## 2024-06-20 LAB — HEMOGLOBIN A1C: Hgb A1c MFr Bld: 5.3 % (ref 4.6–6.5)

## 2024-06-20 MED ORDER — WEGOVY 0.25 MG/0.5ML ~~LOC~~ SOAJ
0.2500 mg | SUBCUTANEOUS | 1 refills | Status: DC
  Filled 2024-06-20: qty 2, 28d supply, fill #0
  Filled 2024-07-07 (×2): qty 2, 28d supply, fill #1

## 2024-06-20 NOTE — Telephone Encounter (Signed)
 Pharmacy Patient Advocate Encounter   Received notification from Physician's Office that prior authorization for Wegovy 0.25MG /0.5ML Auto-injectors is required/requested.   Insurance verification completed.   The patient is insured through [facility].   Per test claim: Refill too soon. PA is not needed at this time. Medication was filled 06/20/24. Next eligible fill date is 07/21/24.  PA EXPIRES 12/23/24

## 2024-06-20 NOTE — Assessment & Plan Note (Signed)
 Her condition is exacerbated by increased weight, with flare-ups under the arms and breasts, especially during pregnancy.  No active lesions currently, no need for antibiotics.

## 2024-06-20 NOTE — Patient Instructions (Signed)
  VISIT SUMMARY: Today, you came in for weight loss management and to establish care. We discussed your history of obesity, BRCA2 gene mutation, vertigo, hidradenitis suppurativa, anxiety, and ADHD. We reviewed your current health status and created a plan to address your concerns.  YOUR PLAN: -OBESITY: Obesity means having an excessive amount of body fat, which can lead to various health issues. We will start you on Wegovy, a medication to help with weight loss, beginning at a low dose and increasing it every four weeks as tolerated. We will also conduct blood work to check your cholesterol and screen for diabetes. Follow up every 1-2 months to monitor your progress and adjust the medication as needed.  -BRCA2 GENE MUTATION (HEREDITARY BREAST AND OVARIAN CANCER RISK): The BRCA2 gene mutation increases your risk of breast and ovarian cancer. The high-risk breast clinic recommends focusing on weight loss before considering preventive surgery.  -HIDRADENITIS SUPPURATIVA: Hidradenitis suppurativa is a skin condition that causes painful lumps under the skin, often exacerbated by weight gain. Managing your weight may help reduce flare-ups.  -ANXIETY: Anxiety involves feelings of worry and stress. We will monitor your symptoms and discuss medication options if your anxiety becomes more limiting.  -VERTIGO (EPISODIC): Vertigo is a sensation of spinning or dizziness. Continue using the maneuvers that have been helpful in managing your symptoms.  INSTRUCTIONS: Please follow up every 1-2 months to monitor your weight and adjust your medication. Use [facility] on [address] and [address] for dispensing your medication. We will also conduct blood work to check your cholesterol and screen for diabetes.

## 2024-06-20 NOTE — Assessment & Plan Note (Signed)
 She has a BRCA2 mutation with a family history of breast cancer. The high-risk breast clinic recommends weight loss before preventive surgery.

## 2024-06-20 NOTE — Assessment & Plan Note (Signed)
 Anxiety is related to health, home, and work stressors. She has ADHD and is open to medication if symptoms become limiting. Monitor anxiety symptoms and discuss medication if symptoms worsen.

## 2024-06-20 NOTE — Assessment & Plan Note (Addendum)
 Obesity with a weight of 269 pounds and BMI of 40 is complicated by lack of exercise and evening hyperphagia. Previous weight loss attempts have been unsuccessful.  She has tried multiple different lifestyle modifications without benefit.  She is interested in medication assisted treatment.  Ordered blood work for cholesterol and diabetes screening.  She has no contraindications to using GLP-1 agonist.  No family history of medullary thyroid  cancer or M EN.  She has a BRCA mutation but this is not associated with increased risk of either of those malignancies.  We talked about the benefits and risks of GLP-1 agonist therapy for weight loss.  She will follow-up with me on a regular basis for monitored weight loss.  Prescribed Wegovy, starting at a low dose and titrating up every four weeks as tolerated. Follow up every 1-2 months to monitor weight and adjust medication.  She is using barrier protection for contraception, understands the cautions of using Wegovy if she becomes pregnant.

## 2024-06-20 NOTE — Progress Notes (Signed)
 New Patient Office Visit  Subjective    Patient ID: [name], female    DOB: Dec 24, 1985  Age: 38 y.o. MRN: [id number]  CC:  Chief Complaint  Patient presents with   Establish Care    Weight loss       HPI  Discussed the use of AI scribe software for clinical note transcription with the patient, who gave verbal consent to proceed.  History of Present Illness [name] is a 38 year old female with obesity and a BRCA2 gene mutation who presents for weight loss management and to establish care.  She has a history of being overweight, with a current weight of 269 pounds and a BMI of 40. This is the heaviest she has been, except during pregnancy. She has attempted various weight loss strategies, including consulting a nutritionist and trying to manage her diet, but has not engaged in regular exercise due to time constraints. She primarily eats in the evening and consumes sodas since her second pregnancy.  She carries the BRCA2 gene mutation, which she has known about since she was 39 years old. She has a significant family history of cancer, with her mother having had premenopausal breast cancer at age 22, and her grandfather having recurrent prostate cancer and lymphoma. She has been followed by a high-risk breast clinic in [place].  She has experienced vertigo intermittently since the birth of her four-year-old child, with a significant episode occurring in September that led to an emergency department visit. She was given exercises to manage the vertigo, which she finds helpful. No current medication use, including iron  supplements, and no history of gestational diabetes.  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 She has a history of competitive swimming and CrossFit but has not been able to maintain regular physical activity due to her busy schedule and  past pregnancy complications.  She has a history of hidradenitis suppurativa, which has been managed with antibiotics during pregnancy. She is not currently using any form of birth control other than condoms and is not planning to have more children.  She describes her anxiety and ADHD symptoms as manageable, though she acknowledges a high-stress lifestyle. She has not been on medication for anxiety but was previously on ADHD medication as a child. She is open to considering medication if her symptoms become more limiting in the future.    Outpatient Encounter Medications as of 06/20/2024  Medication Sig   semaglutide-weight management (WEGOVY) 0.25 MG/0.5ML SOAJ SQ injection Inject 0.25 mg into the skin once a week.   [DISCONTINUED] acetaminophen  (TYLENOL ) 325 MG tablet Take 2 tablets (650 mg total) by mouth every 4 (four) hours as needed (for pain scale < 4).   [DISCONTINUED] benzocaine -Menthol  (DERMOPLAST) 20-0.5 % AERO Apply 1 application topically as needed for irritation (perineal discomfort). (Patient not taking: Reported on 05/26/2022)   [DISCONTINUED] coconut oil OIL Apply 1 application topically as needed.   [DISCONTINUED] ibuprofen  (ADVIL ) 600 MG tablet Take 1 tablet (600 mg total) by mouth every 6 (six) hours.   [DISCONTINUED] iron  polysaccharides (NIFEREX) 150 MG capsule Take 1 capsule (150 mg total) by mouth daily.   [DISCONTINUED] magnesium  oxide (MAG-OX) 400 (241.3 Mg) MG tablet Take 1 tablet (400 mg total) by mouth daily.   [DISCONTINUED] meclizine  (ANTIVERT ) 25 MG tablet Take 1 tablet (25 mg total) by mouth 3 (three) times daily as needed for  dizziness.   No facility-administered encounter medications on file as of 06/20/2024.    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 12/21/2011   History of postpartum hemorrhage    SVT (supraventricular tachycardia)      Past Surgical History:  Procedure Laterality Date   BREAST SURGERY     CYSTECTOMY      Family History  Problem Relation Age of Onset   Cancer Mother    Cancer Maternal Grandfather    Cancer Paternal Grandmother        Objective    BP (!) 113/57   Pulse 85   Ht 5' 8.5 (1.74 m)   Wt 269 lb (122 kg)   SpO2 98%   BMI 40.31 kg/m   Physical Exam  Gen: Well-appearing woman Eyes: Normal Ears: Normal tympanic membranes, normal hearing Mouth: Normal oropharynx Neck: Increase circumference, normal thyroid , no nodules or adenopathy Heart: Regular, no murmur Lungs: Unlabored, clear throughout Abd: Soft, obese, nontender, moderate striae present, no organomegaly Ext: Warm, no edema, normal joints Neuro: Alert, conversational, full strength upper lower extremities, normal gait and balance Psych: Appropriate mood and affect, tearful at times, mildly anxious appearing sometimes, not depressed appearing     Assessment & Plan:    Problem List Items Addressed This Visit       High   Anxiety disorder (Chronic)   Anxiety is related to health, home, and work stressors. She has ADHD and is open to medication if symptoms become limiting. Monitor anxiety symptoms and discuss medication if symptoms worsen.      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 Previous weight loss attempts have been unsuccessful.  She has tried multiple different lifestyle modifications without benefit.  She is interested in medication assisted treatment.  Ordered blood work for cholesterol and diabetes screening.  She has no contraindications to using GLP-1 agonist.  No family history of medullary thyroid  cancer or M EN.  She has a BRCA mutation but this is not associated with increased risk of either of those malignancies.  We talked about the benefits and risks of GLP-1 agonist  therapy for weight loss.  She will follow-up with me on a regular basis for monitored weight loss.  Prescribed Wegovy, starting at a low dose and titrating up every four weeks as tolerated. Follow up every 1-2 months to monitor weight and adjust medication.  She is using barrier protection for contraception, understands the cautions of using Wegovy if she becomes pregnant.      Relevant Medications   semaglutide-weight management (WEGOVY) 0.25 MG/0.5ML SOAJ SQ injection     Low   BRCA2 gene mutation positive (Chronic)   She has a BRCA2 mutation with a family history of breast cancer. The high-risk breast clinic recommends weight loss before preventive surgery.      Hidradenitis suppurativa (Chronic)   Her condition is exacerbated by increased weight, with flare-ups under the arms and breasts, especially during pregnancy.  No active lesions currently, no need for antibiotics.      Other Visit Diagnoses       Screening for lipid disorders       Relevant Orders   Lipid panel     Screening for diabetes mellitus  Relevant Orders   Hemoglobin A1c       Return in about 2 months (around 08/20/2024).   Cleatus Debby Specking, MD

## 2024-06-23 ENCOUNTER — Ambulatory Visit: Payer: Self-pay | Admitting: Student in an Organized Health Care Education/Training Program

## 2024-07-06 ENCOUNTER — Encounter: Payer: Self-pay | Admitting: Student in an Organized Health Care Education/Training Program

## 2024-07-07 ENCOUNTER — Other Ambulatory Visit (HOSPITAL_BASED_OUTPATIENT_CLINIC_OR_DEPARTMENT_OTHER): Payer: Self-pay

## 2024-07-07 NOTE — Telephone Encounter (Signed)
Patient responded.

## 2024-08-06 ENCOUNTER — Encounter: Payer: Self-pay | Admitting: Student in an Organized Health Care Education/Training Program

## 2024-08-06 ENCOUNTER — Telehealth: Admitting: Physician Assistant

## 2024-08-06 DIAGNOSIS — J069 Acute upper respiratory infection, unspecified: Secondary | ICD-10-CM

## 2024-08-06 MED ORDER — IPRATROPIUM BROMIDE 0.03 % NA SOLN: 2.0000 | Freq: Two times a day (BID) | NASAL | 0 refills | Status: DC

## 2024-08-06 MED ORDER — PROMETHAZINE-DM 6.25-15 MG/5ML PO SYRP: 5.0000 mL | ORAL_SOLUTION | Freq: Four times a day (QID) | ORAL | 0 refills | Status: DC | PRN

## 2024-08-06 NOTE — Progress Notes (Signed)
 We are sorry you are not feeling well.  Here is how we plan to help!  Based on what you have shared with me, it looks like you may have a viral upper respiratory infection.  Upper respiratory infections are caused by a large number of viruses; however, rhinovirus is the most common cause.   Symptoms vary from person to person, with common symptoms including sore throat, cough, and fatigue or lack of energy, and a feeling of general discomfort.  A low-grade fever of up to 100.4 may present, but is often uncommon.  Symptoms vary however, and are closely related to a person's age or underlying illnesses.  The most common symptoms associated with an upper respiratory infection are nasal discharge or congestion, cough, sneezing, headache and pressure in the ears and face.  These symptoms usually persist for about 3 to 10 days, but can last up to 2 weeks.  It is important to know that upper respiratory infections do not cause serious illness or complications in most cases.    Upper respiratory infections can be transmitted from person to person, with the most common method of transmission being a person's hands.  The virus is able to live on the skin and can infect other persons for up to 2 hours after direct contact.  Also, these can be transmitted when someone coughs or sneezes; thus, it is important to cover the mouth to reduce this risk.  To keep the spread of the illness at bay, good hand hygiene is very important!  Because this is a viral infection, there are no specific treatments other than to help you with the symptoms until the infection runs its course.    For nasal congestion, you may use an oral decongestants such as Mucinex D or if you have glaucoma or high blood pressure use plain Mucinex.  Saline nasal spray or nasal drops can help and can safely be used as often as needed for congestion.  For your congestion, I have prescribed Ipratropium Bromide nasal spray 0.03% two sprays in each nostril 2-3  times a day -- This will help with the pressure buildup on the ears and plugged sensation.  If you do not have a history of heart disease, hypertension, diabetes or thyroid  disease, prostate/bladder issues or glaucoma, you may also use Sudafed to treat nasal congestion.  It is highly recommended that you consult with a pharmacist or your primary care physician to ensure this medication is safe for you to take.     For cough I have prescribed for you A prescription cough medication called Promethazine-DM 6.25-15 mg/5 mL. Take 5 mL every 6 hours as needed for cough.  If you have a sore or scratchy throat, use a saltwater gargle-  to  teaspoon of salt dissolved in a 4-ounce to 8-ounce glass of warm water.  Gargle the solution for approximately 15-30 seconds and then spit.  It is important not to swallow the solution.  You can also use throat lozenges/cough drops and Chloraseptic spray to help with throat pain or discomfort.  Warm or cold liquids can also be helpful in relieving throat pain.  For headache, pain, or general discomfort, you can use Ibuprofen  or Tylenol  as directed.   Some authorities believe that zinc sprays or the use of Echinacea may shorten the course of your symptoms.   HOME CARE Only take medications as instructed by your medical team. Be sure to drink plenty of fluids. Water is fine as well as fruit juices, sodas  and electrolyte beverages. You may want to stay away from caffeine or alcohol. If you are nauseated, try taking small sips of liquids. How do you know if you are getting enough fluid? Your urine should be a pale yellow or almost colorless. Get rest. Taking a steamy shower or using a humidifier may help nasal congestion and ease sore throat pain. You can place a towel over your head and breathe in the steam from hot water coming from a faucet. Using a saline nasal spray works much the same way. Cough drops, hard candies and sore throat lozenges may ease your  cough. Avoid close contacts especially the very young and the elderly Cover your mouth if you cough or sneeze Always remember to wash your hands.   GET HELP RIGHT AWAY IF: You develop worsening fever. If your symptoms do not improve within 10 days You develop yellow or green discharge from your nose over 3 days. You have coughing fits You develop a severe head ache or visual changes. You develop shortness of breath, difficulty breathing or start having chest pain Your symptoms persist after you have completed your treatment plan  MAKE SURE YOU  Understand these instructions. Will watch your condition. Will get help right away if you are not doing well or get worse.  Your e-visit answers were reviewed by a board certified advanced clinical practitioner to complete your personal care plan. Depending upon the condition, your plan could have included both over-the-counter or prescription medications.  Please review your pharmacy choice. If there is a problem, you may call our nursing hot line at and have the prescription routed to another pharmacy. Your safety is important to us . If you have drug allergies, check your prescription carefully.   You can use MyChart to ask questions about today's visit, request a non-urgent call back, or ask for a work or school excuse for 24 hours related to this e-Visit. If it has been greater than 24 hours you will need to follow up with your provider, or enter a new e-Visit to address those concerns. You will get an e-mail in the next two days asking about your experience.  I hope that your e-visit has been valuable and will speed your recovery. Thank you for using e-visits.   I have spent 5 minutes in review of e-visit questionnaire, review and updating patient chart, medical decision making and response to patient.   Elsie Velma Lunger, PA-C

## 2024-08-07 MED ORDER — WEGOVY 0.5 MG/0.5ML ~~LOC~~ SOAJ: 0.5000 mg | SUBCUTANEOUS | 2 refills | Status: DC

## 2024-08-07 NOTE — Telephone Encounter (Signed)
 Please advise, thank you.

## 2024-08-12 ENCOUNTER — Other Ambulatory Visit: Payer: Self-pay | Admitting: Student in an Organized Health Care Education/Training Program

## 2024-08-12 ENCOUNTER — Other Ambulatory Visit (HOSPITAL_BASED_OUTPATIENT_CLINIC_OR_DEPARTMENT_OTHER): Payer: Self-pay

## 2024-08-12 MED ORDER — WEGOVY 0.5 MG/0.5ML ~~LOC~~ SOAJ
0.5000 mg | SUBCUTANEOUS | 2 refills | Status: DC
  Filled 2024-08-12: qty 2, 28d supply, fill #0

## 2024-08-12 NOTE — Telephone Encounter (Signed)
 Copied from CRM #[id number]. Topic: Clinical - Medication Refill >> Aug 12, 2024  3:28 PM Mercedes G wrote: Medication: semaglutide -weight management (WEGOVY ) 0.5 MG/0.5ML SOAJ SQ injection Patient medication was sent to wrong pharmacy please resend she is on her way to pick up.  Has the patient contacted their pharmacy? Yes (Agent: If no, request that the patient contact the pharmacy for the refill. If patient does not wish to contact the pharmacy document the reason why and proceed with request.) (Agent: If yes, when and what did the pharmacy advise?)  This is the patient's preferred pharmacy:   [facility] [address] [place] [address] Phone: [phone] Fax: [phone]  Is this the correct pharmacy for this prescription? Yes If no, delete pharmacy and type the correct one.   Has the prescription been filled recently? Yes  Is the patient out of the medication? Yes  Has the patient been seen for an appointment in the last year OR does the patient have an upcoming appointment? Yes  Can we respond through MyChart? Yes  Agent: Please be advised that Rx refills may take up to 3 business days. We ask that you follow-up with your pharmacy.

## 2024-08-13 ENCOUNTER — Other Ambulatory Visit (HOSPITAL_BASED_OUTPATIENT_CLINIC_OR_DEPARTMENT_OTHER): Payer: Self-pay

## 2024-08-20 ENCOUNTER — Ambulatory Visit: Admitting: Student in an Organized Health Care Education/Training Program

## 2024-08-20 ENCOUNTER — Encounter: Payer: Self-pay | Admitting: Student in an Organized Health Care Education/Training Program

## 2024-08-20 VITALS — BP 114/53 | HR 87 | Wt 262.0 lb

## 2024-08-20 DIAGNOSIS — E66813 Obesity, class 3: Secondary | ICD-10-CM | POA: Diagnosis not present

## 2024-08-20 DIAGNOSIS — R41840 Attention and concentration deficit: Secondary | ICD-10-CM | POA: Diagnosis not present

## 2024-08-20 DIAGNOSIS — F988 Other specified behavioral and emotional disorders with onset usually occurring in childhood and adolescence: Secondary | ICD-10-CM | POA: Insufficient documentation

## 2024-08-20 NOTE — Progress Notes (Signed)
° °  Established Patient Office Visit  Patient ID: Alayja Armas, female    DOB: 28-Nov-1985  Age: 38 y.o. MRN: [id number] PCP: Jerrell Cleatus Ned, MD  Chief Complaint  Patient presents with   [facility] management     2 month follow up     Subjective:     HPI  Discussed the use of AI scribe software for clinical note transcription with the patient, who gave verbal consent to proceed.  History of Present Illness Makaela Cando Anjelina Dung is a 38 year old female who presents for a follow-up on Wegovy  treatment.  She started Wegovy  two months ago and has experienced no significant side effects, except for dental issues, which she attributes to long-term neglect. She has a history of dental problems, including gum surgery and braces, and plans to visit a dentist soon. She reports losing seven pounds in two months and notes that she took a 0.5 mg dose of Wegovy  on December 11th, after previously taking 0.25 mg doses. Her insurance covers the medication until April.  She experiences stress and symptoms of ADHD, including executive dysfunction and difficulty managing tasks at home, despite performing well at work. She has a history of using ADHD medications like Triderm, Concerta, and Strattera in her teenage years but stopped due to side effects and personal choice.  She feels stressed and overworked, having not taken much time off work. She denies using any recreational drugs or alcohol, citing a preference for maintaining control.     Objective:     BP (!) 114/53   Pulse 87   Wt 262 lb (118.8 kg)   SpO2 100%   BMI 39.26 kg/m   Physical Exam  Gen: Well-appearing woman Neck: Normal thyroid , no nodules or adenopathy Heart: Regular, no murmur Lungs: Unlabored, clear throughout Ext: Warm, no edema    Assessment & Plan:   Problem List Items Addressed This Visit       High   Obesity, Class III, BMI 40-49.9 (HCC) (Chronic)   Obesity management with Wegovy  has resulted  in a 7-pound weight loss over two months without significant side effects. She is currently on a 0.5 mg dose, with plans to increase to 1 mg if tolerated. Continue Wegovy  at 0.5 mg weekly and increase to 1 mg after four injections if tolerated. She should message the provider via MyChart after the fourth injection to discuss the dose increase. Exercise, including strength training, is encouraged to preserve muscle mass during weight loss. Follow up in three months to check weight and discuss the continuation of Wegovy .      ADD (attention deficit disorder) - Primary   Symptoms are consistent with ADHD. Stimulant and non-stimulant medication options were discussed, emphasizing reasonable expectations and monitoring needs.  Patient has tried many different medications when she was younger, and she is unsure if she wants to restart right now.  Symptom burden is moderate to severe on the ASRS.  Consider a trial of attention medication if desired, and discuss potential medication options and side effects. Follow up in three months to reassess symptoms and discuss potential medication initiation.         Return in about 3 months (around 11/18/2024).    Cleatus Ned Jerrell, MD [facility]

## 2024-08-20 NOTE — Assessment & Plan Note (Signed)
 Obesity management with Wegovy  has resulted in a 7-pound weight loss over two months without significant side effects. She is currently on a 0.5 mg dose, with plans to increase to 1 mg if tolerated. Continue Wegovy  at 0.5 mg weekly and increase to 1 mg after four injections if tolerated. She should message the provider via MyChart after the fourth injection to discuss the dose increase. Exercise, including strength training, is encouraged to preserve muscle mass during weight loss. Follow up in three months to check weight and discuss the continuation of Wegovy .

## 2024-08-20 NOTE — Assessment & Plan Note (Signed)
 Symptoms are consistent with ADHD. Stimulant and non-stimulant medication options were discussed, emphasizing reasonable expectations and monitoring needs.  Patient has tried many different medications when she was younger, and she is unsure if she wants to restart right now.  Symptom burden is moderate to severe on the ASRS.  Consider a trial of attention medication if desired, and discuss potential medication options and side effects. Follow up in three months to reassess symptoms and discuss potential medication initiation.

## 2024-08-20 NOTE — Patient Instructions (Addendum)
°  VISIT SUMMARY: Today, we discussed your progress with Wegovy  treatment and addressed your concerns about ADHD symptoms. You have lost seven pounds in two months with Wegovy , and we talked about increasing the dose if tolerated. We also explored options for managing your ADHD symptoms.  YOUR PLAN: -OBESITY, CLASS III: Obesity is a condition where you have an excessive amount of body fat, which can increase the risk of other health problems. You have lost seven pounds in two months with Wegovy , and you are currently on a 0.5 mg dose. If you tolerate this dose well, you can increase it to 1 mg after four injections. Please message me via MyChart after your fourth injection to discuss the dose increase. Additionally, incorporating exercise, especially strength training, is recommended to help preserve muscle mass during weight loss. We will follow up in three months to check your weight and discuss the continuation of Wegovy .  -ATTENTION AND CONCENTRATION DEFICIT: ADHD is a condition that affects your ability to focus and manage tasks. We discussed both stimulant and non-stimulant medication options to help manage your symptoms. If you are interested, we can consider a trial of attention medication and discuss the potential options and side effects. We will follow up in three months to reassess your symptoms and discuss the possibility of starting medication.  INSTRUCTIONS: Please follow up in three months to check your weight and discuss the continuation of Wegovy , as well as to reassess your ADHD symptoms and discuss potential medication options. Message me via MyChart after your fourth injection of Wegovy  to discuss increasing the dose to 1 mg.

## 2024-09-05 ENCOUNTER — Encounter: Payer: Self-pay | Admitting: Student in an Organized Health Care Education/Training Program

## 2024-09-05 ENCOUNTER — Other Ambulatory Visit (HOSPITAL_BASED_OUTPATIENT_CLINIC_OR_DEPARTMENT_OTHER): Payer: Self-pay

## 2024-09-05 MED ORDER — WEGOVY 1 MG/0.5ML ~~LOC~~ SOAJ
1.0000 mg | SUBCUTANEOUS | 2 refills | Status: DC
  Filled 2024-09-05: qty 2, 28d supply, fill #0

## 2024-09-05 NOTE — Telephone Encounter (Signed)
 Patient would like to increase her Wegovy . Okay to increase?

## 2024-09-25 ENCOUNTER — Encounter: Payer: Self-pay | Admitting: Student in an Organized Health Care Education/Training Program

## 2024-10-01 ENCOUNTER — Other Ambulatory Visit (HOSPITAL_BASED_OUTPATIENT_CLINIC_OR_DEPARTMENT_OTHER): Payer: Self-pay

## 2024-10-01 MED ORDER — WEGOVY 1.7 MG/0.75ML ~~LOC~~ SOAJ
1.7000 mg | SUBCUTANEOUS | 1 refills | Status: AC
  Filled 2024-10-01: qty 3, 28d supply, fill #0

## 2024-10-01 NOTE — Telephone Encounter (Signed)
 Patient is requesting wegovy  1.7 mg formulation per previous message from provider this is appropriate.

## 2024-11-20 ENCOUNTER — Ambulatory Visit: Admitting: Student in an Organized Health Care Education/Training Program
